# Patient Record
Sex: Female | Born: 1995 | Race: White | Hispanic: No | Marital: Single | Smoking: Current every day smoker
Health system: Southern US, Community
[De-identification: ages and names within clinical notes are randomized; demographics above are authoritative.]

## PROBLEM LIST (undated history)

## (undated) DIAGNOSIS — I10 Essential (primary) hypertension: Secondary | ICD-10-CM

## (undated) DIAGNOSIS — F319 Bipolar disorder, unspecified: Secondary | ICD-10-CM

## (undated) HISTORY — PX: NO PAST SURGERIES: SHX2092

---
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
## 2017-08-15 ENCOUNTER — Emergency Department: Payer: Medicaid Other

## 2017-08-15 ENCOUNTER — Emergency Department
Admission: EM | Admit: 2017-08-15 | Discharge: 2017-08-15 | Disposition: A | Discharge status: Home / Self Care | Payer: Medicaid Other | Attending: Student in an Organized Health Care Education/Training Program | Admitting: Student in an Organized Health Care Education/Training Program

## 2017-08-15 ENCOUNTER — Other Ambulatory Visit: Payer: Self-pay

## 2017-08-15 DIAGNOSIS — R1031 Right lower quadrant pain: Secondary | ICD-10-CM | POA: Insufficient documentation

## 2017-08-15 DIAGNOSIS — R102 Pelvic and perineal pain: Secondary | ICD-10-CM | POA: Insufficient documentation

## 2017-08-15 DIAGNOSIS — N739 Female pelvic inflammatory disease, unspecified: Secondary | ICD-10-CM | POA: Diagnosis not present

## 2017-08-15 DIAGNOSIS — F17210 Nicotine dependence, cigarettes, uncomplicated: Secondary | ICD-10-CM | POA: Insufficient documentation

## 2017-08-15 DIAGNOSIS — N898 Other specified noninflammatory disorders of vagina: Secondary | ICD-10-CM | POA: Insufficient documentation

## 2017-08-15 DIAGNOSIS — R1084 Generalized abdominal pain: Secondary | ICD-10-CM | POA: Diagnosis present

## 2017-08-15 DIAGNOSIS — N730 Acute parametritis and pelvic cellulitis: Secondary | ICD-10-CM

## 2017-08-15 LAB — POCT PREGNANCY, URINE: Preg Test, Ur: NEGATIVE

## 2017-08-15 LAB — WET PREP, GENITAL
Sperm: NONE SEEN
Trich, Wet Prep: NONE SEEN
Yeast Wet Prep HPF POC: NONE SEEN

## 2017-08-15 LAB — URINALYSIS, COMPLETE (UACMP) WITH MICROSCOPIC
Bacteria, UA: NONE SEEN
Glucose, UA: NEGATIVE mg/dL
Hgb urine dipstick: NEGATIVE
Ketones, ur: 5 mg/dL — AB
Nitrite: NEGATIVE
Protein, ur: 100 mg/dL — AB
RBC / HPF: >50 RBC/hpf — ABNORMAL HIGH (ref 0–5)
Specific Gravity, Urine: 1.033 — ABNORMAL HIGH (ref 1.005–1.030)
WBC, UA: >50 WBC/hpf — ABNORMAL HIGH (ref 0–5)
pH: 6 (ref 5.0–8.0)

## 2017-08-15 LAB — CBC WITH DIFFERENTIAL/PLATELET
Basophils Absolute: 0 K/uL (ref 0–0.1)
Basophils Relative: 0 %
Eosinophils Absolute: 0.1 K/uL (ref 0–0.7)
Eosinophils Relative: 1 %
HCT: 37.6 % (ref 35.0–47.0)
Hemoglobin: 12.6 g/dL (ref 12.0–16.0)
Lymphocytes Relative: 19 %
Lymphs Abs: 1.9 K/uL (ref 1.0–3.6)
MCH: 29.6 pg (ref 26.0–34.0)
MCHC: 33.6 g/dL (ref 32.0–36.0)
MCV: 88.1 fL (ref 80.0–100.0)
Monocytes Absolute: 1.2 K/uL — ABNORMAL HIGH (ref 0.2–0.9)
Monocytes Relative: 11 %
Neutro Abs: 7.1 K/uL — ABNORMAL HIGH (ref 1.4–6.5)
Neutrophils Relative %: 69 %
Platelets: 235 K/uL (ref 150–440)
RBC: 4.27 MIL/uL (ref 3.80–5.20)
RDW: 13.6 % (ref 11.5–14.5)
WBC: 10.3 K/uL (ref 3.6–11.0)

## 2017-08-15 LAB — CHLAMYDIA/NGC RT PCR (ARMC ONLY)
Chlamydia Tr: DETECTED — AB
N gonorrhoeae: DETECTED — AB

## 2017-08-15 LAB — COMPREHENSIVE METABOLIC PANEL
ALBUMIN: 3.8 g/dL (ref 3.5–5.0)
ALT: 9 U/L (ref 0–44)
ANION GAP: 5 (ref 5–15)
AST: 16 U/L (ref 15–41)
Alkaline Phosphatase: 59 U/L (ref 38–126)
BILIRUBIN TOTAL: 0.6 mg/dL (ref 0.3–1.2)
BUN: 8 mg/dL (ref 6–20)
CO2: 27 mmol/L (ref 22–32)
Calcium: 8.7 mg/dL — ABNORMAL LOW (ref 8.9–10.3)
Chloride: 105 mmol/L (ref 98–111)
Creatinine, Ser: 0.86 mg/dL (ref 0.44–1.00)
GFR calc Af Amer: >60 mL/min (ref >60)
GLUCOSE: 99 mg/dL (ref 70–99)
POTASSIUM: 3.7 mmol/L (ref 3.5–5.1)
Sodium: 137 mmol/L (ref 135–145)
TOTAL PROTEIN: 7.1 g/dL (ref 6.5–8.1)

## 2017-08-15 MED ORDER — PROMETHAZINE HCL 25 MG/ML IJ SOLN
12.5000 mg | Freq: Four times a day (QID) | INTRAMUSCULAR | Status: DC | PRN
Start: 2017-08-15 — End: 2017-08-15
  Administered 2017-08-15: 12.5 mg via INTRAVENOUS
  Filled 2017-08-15: qty 1

## 2017-08-15 MED ORDER — AZITHROMYCIN 500 MG PO TABS
1000.0000 mg | ORAL_TABLET | Freq: Once | ORAL | Status: AC
  Administered 2017-08-15: 1000 mg via ORAL
  Filled 2017-08-15: qty 2

## 2017-08-15 MED ORDER — METRONIDAZOLE 500 MG PO TABS: 500.0000 mg | ORAL_TABLET | Freq: Two times a day (BID) | ORAL | 0 refills | Status: AC

## 2017-08-15 MED ORDER — PROMETHAZINE HCL 12.5 MG PO TABS: 12.5000 mg | ORAL_TABLET | Freq: Four times a day (QID) | ORAL | 0 refills | Status: DC | PRN

## 2017-08-15 MED ORDER — DOXYCYCLINE HYCLATE 100 MG PO TABS: 100.0000 mg | ORAL_TABLET | Freq: Two times a day (BID) | ORAL | 0 refills | Status: AC

## 2017-08-15 MED ORDER — ONDANSETRON HCL 4 MG/2ML IJ SOLN
4.0000 mg | Freq: Once | INTRAMUSCULAR | Status: AC
  Administered 2017-08-15: 4 mg via INTRAVENOUS
  Filled 2017-08-15: qty 2

## 2017-08-15 MED ORDER — SODIUM CHLORIDE 0.9 % IV BOLUS
1000.0000 mL | Freq: Once | INTRAVENOUS | Status: AC
  Administered 2017-08-15: 1000 mL via INTRAVENOUS

## 2017-08-15 MED ORDER — DOXYCYCLINE HYCLATE 100 MG PO TABS
100.0000 mg | ORAL_TABLET | Freq: Once | ORAL | Status: AC
  Administered 2017-08-15: 100 mg via ORAL
  Filled 2017-08-15: qty 1

## 2017-08-15 MED ORDER — KETOROLAC TROMETHAMINE 30 MG/ML IJ SOLN
15.0000 mg | Freq: Once | INTRAMUSCULAR | Status: AC
  Administered 2017-08-15: 15 mg via INTRAVENOUS
  Filled 2017-08-15: qty 1

## 2017-08-15 MED ORDER — MORPHINE SULFATE (PF) 4 MG/ML IV SOLN
4.0000 mg | INTRAVENOUS | Status: DC | PRN
  Administered 2017-08-15 (×2): 4 mg via INTRAVENOUS
  Filled 2017-08-15 (×2): qty 1

## 2017-08-15 MED ORDER — HYDROCODONE-ACETAMINOPHEN 5-325 MG PO TABS: 1.0000 | ORAL_TABLET | ORAL | 0 refills | Status: DC | PRN

## 2017-08-15 MED ORDER — IOPAMIDOL (ISOVUE-370) INJECTION 76%
75.0000 mL | Freq: Once | INTRAVENOUS | Status: AC | PRN
  Administered 2017-08-15: 75 mL via INTRAVENOUS

## 2017-08-15 MED ORDER — CEFTRIAXONE SODIUM 250 MG IJ SOLR
250.0000 mg | Freq: Once | INTRAMUSCULAR | Status: AC
Start: 2017-08-15 — End: 2017-08-15
  Administered 2017-08-15: 250 mg via INTRAMUSCULAR
  Filled 2017-08-15: qty 250

## 2017-08-15 NOTE — ED Provider Notes (Signed)
[facility] Emergency Department Provider Note    First MD Initiated Contact with Patient 08/15/17 [phone]     (approximate)  I have reviewed the triage vital signs and the nursing notes.   HISTORY  Chief Complaint Abdominal Pain    HPI Tammy Owen is a 22 y.o. female G2, P2 presents the ER with chief complaint of 3 days of progressively worsening generalized abdominal pain.  States that initially started on the right lower quadrant and progressed.  Denies any fevers but has not been able to eat much.  Denies any vomiting.  No diarrhea.  States that 3 days ago she did have heavy menstrual cycle consistent with very heavy periods and was having crampy abdominal pain then.  Since then she has noted some vaginal discharge.  She is still sexually active.  Unsure if she is pregnant today.  Came to the ER because the pain is been unrelenting.    History reviewed. No pertinent past medical history. No family history on file. History reviewed. No pertinent surgical history. There are no active problems to display for this patient.     Prior to Admission medications   Medication Sig Start Date End Date Taking? Authorizing Provider  doxycycline (VIBRA-TABS) 100 MG tablet Take 1 tablet (100 mg total) by mouth 2 (two) times daily for 14 days. 08/15/17 08/29/17  Willy Eddy, MD  HYDROcodone-acetaminophen (NORCO) 5-325 MG tablet Take 1 tablet by mouth every 4 (four) hours as needed for moderate pain. 08/15/17   Willy Eddy, MD  metroNIDAZOLE (FLAGYL) 500 MG tablet Take 1 tablet (500 mg total) by mouth 2 (two) times daily for 14 days. 08/15/17 08/29/17  Willy Eddy, MD  promethazine (PHENERGAN) 12.5 MG tablet Take 1 tablet (12.5 mg total) by mouth every 6 (six) hours as needed for nausea or vomiting. 08/15/17   Willy Eddy, MD    Allergies Robitussin (alcohol free) [guaifenesin]    Social History Social History   Tobacco Use  . Smoking  status: Current Every Day Smoker    Types: Cigarettes  . Smokeless tobacco: Never Used  Substance Use Topics  . Alcohol use: Yes    Comment: socially  . Drug use: No    Review of Systems Patient denies headaches, rhinorrhea, blurry vision, numbness, shortness of breath, chest pain, edema, cough, abdominal pain, nausea, vomiting, diarrhea, dysuria, fevers, rashes or hallucinations unless otherwise stated above in HPI. ____________________________________________   PHYSICAL EXAM:  VITAL SIGNS: Vitals:   08/15/17 1144 08/15/17 1300  BP: 115/73 104/70  Pulse: 78 83  Resp: 16   Temp:    SpO2: 100% 99%    Constitutional: Alert and oriented.  Eyes: Conjunctivae are normal.  Head: Atraumatic. Nose: No congestion/rhinnorhea. Mouth/Throat: Mucous membranes are moist.   Neck: No stridor. Painless ROM.  Cardiovascular: Normal rate, regular rhythm. Grossly normal heart sounds.  Good peripheral circulation. Respiratory: Normal respiratory effort.  No retractions. Lungs CTAB. Gastrointestinal: Soft with ttp most noted in RLQ. No distention. No abdominal bruits. No CVA tenderness. Genitourinary: Normal-appearing genitalia no erythema of the cervix but there is foul-smelling greenish discharge coming from the cervical loss.  Does have some pain but not exquisite cervical motion tenderness. Musculoskeletal: No lower extremity tenderness nor edema.  No joint effusions. Neurologic:  Normal speech and language. No gross focal neurologic deficits are appreciated. No facial droop Skin:  Skin is warm, dry and intact. No rash noted. Psychiatric: Mood and affect are normal. Speech and behavior are normal.  ____________________________________________   LABS (all labs ordered are listed, but only abnormal results are displayed)  Results for orders placed or performed during the hospital encounter of 08/15/17 (from the past 24 hour(s))  CBC with Differential/Platelet     Status: Abnormal    Collection Time: 08/15/17  7:50 AM  Result Value Ref Range   WBC 10.3 3.6 - 11.0 K/uL   RBC 4.27 3.80 - 5.20 MIL/uL   Hemoglobin 12.6 12.0 - 16.0 g/dL   HCT 16.1 09.6 - 04.5 %   MCV 88.1 80.0 - 100.0 fL   MCH 29.6 26.0 - 34.0 pg   MCHC 33.6 32.0 - 36.0 g/dL   RDW 40.9 81.1 - 91.4 %   Platelets 235 150 - 440 K/uL   Neutrophils Relative % 69 %   Neutro Abs 7.1 (H) 1.4 - 6.5 K/uL   Lymphocytes Relative 19 %   Lymphs Abs 1.9 1.0 - 3.6 K/uL   Monocytes Relative 11 %   Monocytes Absolute 1.2 (H) 0.2 - 0.9 K/uL   Eosinophils Relative 1 %   Eosinophils Absolute 0.1 0 - 0.7 K/uL   Basophils Relative 0 %   Basophils Absolute 0.0 0 - 0.1 K/uL  Comprehensive metabolic panel     Status: Abnormal   Collection Time: 08/15/17  7:50 AM  Result Value Ref Range   Sodium 137 135 - 145 mmol/L   Potassium 3.7 3.5 - 5.1 mmol/L   Chloride 105 98 - 111 mmol/L   CO2 27 22 - 32 mmol/L   Glucose, Bld 99 70 - 99 mg/dL   BUN 8 6 - 20 mg/dL   Creatinine, Ser 7.82 0.44 - 1.00 mg/dL   Calcium 8.7 (L) 8.9 - 10.3 mg/dL   Total Protein 7.1 6.5 - 8.1 g/dL   Albumin 3.8 3.5 - 5.0 g/dL   AST 16 15 - 41 U/L   ALT 9 0 - 44 U/L   Alkaline Phosphatase 59 38 - 126 U/L   Total Bilirubin 0.6 0.3 - 1.2 mg/dL   GFR calc non Af Amer >60 >60 mL/min   GFR calc Af Amer >60 >60 mL/min   Anion gap 5 5 - 15  Urinalysis, Complete w Microscopic     Status: Abnormal   Collection Time: 08/15/17  7:50 AM  Result Value Ref Range   Color, Urine AMBER (A) YELLOW   APPearance CLOUDY (A) CLEAR   Specific Gravity, Urine 1.033 (H) 1.005 - 1.030   pH 6.0 5.0 - 8.0   Glucose, UA NEGATIVE NEGATIVE mg/dL   Hgb urine dipstick NEGATIVE NEGATIVE   Bilirubin Urine SMALL (A) NEGATIVE   Ketones, ur 5 (A) NEGATIVE mg/dL   Protein, ur 956 (A) NEGATIVE mg/dL   Nitrite NEGATIVE NEGATIVE   Leukocytes, UA MODERATE (A) NEGATIVE   RBC / HPF >50 (H) 0 - 5 RBC/hpf   WBC, UA >50 (H) 0 - 5 WBC/hpf   Bacteria, UA NONE SEEN NONE SEEN   Squamous  Epithelial / LPF 21-50 0 - 5   Mucus PRESENT   Pregnancy, urine POC     Status: None   Collection Time: 08/15/17  7:57 AM  Result Value Ref Range   Preg Test, Ur NEGATIVE NEGATIVE  Wet prep, genital     Status: Abnormal   Collection Time: 08/15/17  8:05 AM  Result Value Ref Range   Yeast Wet Prep HPF POC NONE SEEN NONE SEEN   Trich, Wet Prep NONE SEEN NONE SEEN   Clue Cells  Wet Prep HPF POC PRESENT (A) NONE SEEN   WBC, Wet Prep HPF POC MANY (A) NONE SEEN   Sperm NONE SEEN   Chlamydia/NGC rt PCR ([facility] only)     Status: Abnormal   Collection Time: 08/15/17  8:05 AM  Result Value Ref Range   Specimen source GC/Chlam ENDOCERVICAL    Chlamydia Tr DETECTED (A) NOT DETECTED   N gonorrhoeae DETECTED (A) NOT DETECTED   ____________________________________________ ____________________________________________  RADIOLOGY  I personally reviewed all radiographic images ordered to evaluate for the above acute complaints and reviewed radiology reports and findings.  These findings were personally discussed with the patient.  Please see medical record for radiology report.  ____________________________________________   PROCEDURES  Procedure(s) performed:  Procedures    Critical Care performed: no ____________________________________________   INITIAL IMPRESSION / ASSESSMENT AND PLAN / ED COURSE  Pertinent labs & imaging results that were available during my care of the patient were reviewed by me and considered in my medical decision making (see chart for details).   DDX: ectopic, torsion, appy, pid, toa, colitis, menses  Tammy Candice Larrick is a 22 y.o. who presents to the ED with right lower quadrant pain as described above.  Patient very uncomfortable appearing.  Physical exam as above.  Patient with some concerning abdominal findings suggestive of early peritonitis and given her presentation I am concerned for appendicitis.  Possible TOA.  CT imaging will be ordered to further  differentiate.  Will provide IV fluids as well as IV pain medication and IV antiemetics.  Clinical Course as of Aug 15 1441  Wed Aug 15, 2017  0951 CT scan does not show any evidence of acute appendicitis but there is right sided exophytic mass concerning for ovarian cyst.  Patient with worsening pain.  Will order ultrasound to evaluate for torsion.   [PR]  1106 Gonorrhea and Chlamydia are positive.  Patient will be treated for PID.  Ultrasound ordered due to concern for TOA.   [PR]  1325 Patient reassessed.  Pain improved.  Ultrasound shows evidence of probable dominant follicle and cyst.  No evidence of enhancement or hyperechoic material to suggest abscess.  Patient remains afebrile and no signs of sepsis.  At this point presentation most clinically consistent with PID and I do believe patient stable and appropriate for discharge home.   [PR]    Clinical Course User Index [PR] Willy Eddy, MD     As part of my medical decision making, I reviewed the following data within the electronic MEDICAL RECORD NUMBER Nursing notes reviewed and incorporated, Labs reviewed, notes from prior ED visits and [place] Controlled Substance Database   ____________________________________________   FINAL CLINICAL IMPRESSION(S) / ED DIAGNOSES  Final diagnoses:  Pelvic pain  Acute pelvic inflammatory disease (PID)      NEW MEDICATIONS STARTED DURING THIS VISIT:  Discharge Medication List as of 08/15/2017  1:33 PM    START taking these medications   Details  doxycycline (VIBRA-TABS) 100 MG tablet Take 1 tablet (100 mg total) by mouth 2 (two) times daily for 14 days., Starting Wed 08/15/2017, Until Wed 08/29/2017, Print    HYDROcodone-acetaminophen (NORCO) 5-325 MG tablet Take 1 tablet by mouth every 4 (four) hours as needed for moderate pain., Starting Wed 08/15/2017, Print    metroNIDAZOLE (FLAGYL) 500 MG tablet Take 1 tablet (500 mg total) by mouth 2 (two) times daily for 14 days., Starting Wed  08/15/2017, Until Wed 08/29/2017, Print    promethazine (PHENERGAN) 12.5 MG tablet Take  1 tablet (12.5 mg total) by mouth every 6 (six) hours as needed for nausea or vomiting., Starting Wed 08/15/2017, Print         Note:  This document was prepared using Dragon voice recognition software and may include unintentional dictation errors.    Willy Eddyobinson, Monesha Monreal, MD 08/15/17 [phone]

## 2017-08-15 NOTE — ED Notes (Signed)
Pt tolerating PO at this time.

## 2017-08-15 NOTE — ED Notes (Signed)
Patient transported to US 

## 2017-08-15 NOTE — ED Triage Notes (Addendum)
Pt c/o generalized abd pain for 3 days with N/V for the first 2 days. States she had an unusually heavy period last week.

## 2017-08-15 NOTE — ED Notes (Signed)
Patient transported to CT 

## 2017-08-15 NOTE — ED Notes (Signed)
EDP request PO challenge; pt provided graham crackers and peanut butter at this time.

## 2017-08-15 NOTE — ED Notes (Signed)
Pt stating that he pain began in her lower abdominal pain that spread all over. Pt stating a heavy periods since her baby 5 months ago with an abnormal bleeding a couple days ago. Pt stating some mild nausea. Pt stating blood in her urine. Pt stating yellow vaginal drainage.

## 2017-11-19 ENCOUNTER — Encounter: Payer: Self-pay | Admitting: Emergency Medicine

## 2017-11-19 ENCOUNTER — Ambulatory Visit
Admission: EM | Admit: 2017-11-19 | Discharge: 2017-11-19 | Disposition: A | Discharge status: Home / Self Care | Payer: Medicaid Other | Attending: Family Medicine | Admitting: Family Medicine

## 2017-11-19 ENCOUNTER — Other Ambulatory Visit: Payer: Self-pay

## 2017-11-19 DIAGNOSIS — N760 Acute vaginitis: Secondary | ICD-10-CM

## 2017-11-19 DIAGNOSIS — A5901 Trichomonal vulvovaginitis: Secondary | ICD-10-CM | POA: Insufficient documentation

## 2017-11-19 DIAGNOSIS — R109 Unspecified abdominal pain: Secondary | ICD-10-CM | POA: Diagnosis present

## 2017-11-19 DIAGNOSIS — Z79899 Other long term (current) drug therapy: Secondary | ICD-10-CM | POA: Diagnosis not present

## 2017-11-19 DIAGNOSIS — Z202 Contact with and (suspected) exposure to infections with a predominantly sexual mode of transmission: Secondary | ICD-10-CM | POA: Diagnosis present

## 2017-11-19 DIAGNOSIS — F17210 Nicotine dependence, cigarettes, uncomplicated: Secondary | ICD-10-CM | POA: Insufficient documentation

## 2017-11-19 DIAGNOSIS — B9689 Other specified bacterial agents as the cause of diseases classified elsewhere: Secondary | ICD-10-CM | POA: Diagnosis not present

## 2017-11-19 LAB — COMPREHENSIVE METABOLIC PANEL
ALT: 14 U/L (ref 0–44)
AST: 16 U/L (ref 15–41)
Albumin: 3.3 g/dL — ABNORMAL LOW (ref 3.5–5.0)
Alkaline Phosphatase: 54 U/L (ref 38–126)
Anion gap: 9 (ref 5–15)
BUN: 9 mg/dL (ref 6–20)
CHLORIDE: 99 mmol/L (ref 98–111)
CO2: 27 mmol/L (ref 22–32)
CREATININE: 0.59 mg/dL (ref 0.44–1.00)
Calcium: 9 mg/dL (ref 8.9–10.3)
GFR calc Af Amer: >60 mL/min (ref >60)
GLUCOSE: 101 mg/dL — AB (ref 70–99)
POTASSIUM: 4.3 mmol/L (ref 3.5–5.1)
SODIUM: 135 mmol/L (ref 135–145)
Total Bilirubin: 0.1 mg/dL — ABNORMAL LOW (ref 0.3–1.2)
Total Protein: 8.1 g/dL (ref 6.5–8.1)

## 2017-11-19 LAB — CBC WITH DIFFERENTIAL/PLATELET
ABS IMMATURE GRANULOCYTES: 0.03 10*3/uL (ref 0.00–0.07)
BASOS PCT: 1 %
Basophils Absolute: 0 10*3/uL (ref 0.0–0.1)
Eosinophils Absolute: 0.1 10*3/uL (ref 0.0–0.5)
Eosinophils Relative: 2 %
HCT: 37.4 % (ref 36.0–46.0)
Hemoglobin: 11.9 g/dL — ABNORMAL LOW (ref 12.0–15.0)
IMMATURE GRANULOCYTES: 0 %
LYMPHS ABS: 2.1 10*3/uL (ref 0.7–4.0)
Lymphocytes Relative: 24 %
MCH: 28.7 pg (ref 26.0–34.0)
MCHC: 31.8 g/dL (ref 30.0–36.0)
MCV: 90.3 fL (ref 80.0–100.0)
MONOS PCT: 7 %
Monocytes Absolute: 0.7 10*3/uL (ref 0.1–1.0)
NEUTROS ABS: 5.8 10*3/uL (ref 1.7–7.7)
NEUTROS PCT: 66 %
NRBC: 0 % (ref 0.0–0.2)
PLATELETS: 542 10*3/uL — AB (ref 150–400)
RBC: 4.14 MIL/uL (ref 3.87–5.11)
RDW: 13.4 % (ref 11.5–15.5)
WBC: 8.8 10*3/uL (ref 4.0–10.5)

## 2017-11-19 LAB — URINALYSIS, COMPLETE (UACMP) WITH MICROSCOPIC
BACTERIA UA: NONE SEEN
Bilirubin Urine: NEGATIVE
GLUCOSE, UA: NEGATIVE mg/dL
Ketones, ur: NEGATIVE mg/dL
Leukocytes, UA: NEGATIVE
Nitrite: NEGATIVE
PH: 6.5 (ref 5.0–8.0)
PROTEIN: NEGATIVE mg/dL
RBC / HPF: NONE SEEN RBC/hpf (ref 0–5)
Specific Gravity, Urine: 1.02 (ref 1.005–1.030)

## 2017-11-19 LAB — WET PREP, GENITAL
Sperm: NONE SEEN
Yeast Wet Prep HPF POC: NONE SEEN

## 2017-11-19 LAB — CHLAMYDIA/NGC RT PCR (ARMC ONLY)
CHLAMYDIA TR: DETECTED — AB
N GONORRHOEAE: NOT DETECTED

## 2017-11-19 LAB — LIPASE, BLOOD: Lipase: 24 U/L (ref 11–51)

## 2017-11-19 LAB — PREGNANCY, URINE: Preg Test, Ur: NEGATIVE

## 2017-11-19 MED ORDER — ONDANSETRON 8 MG PO TBDP
8.0000 mg | ORAL_TABLET | Freq: Once | ORAL | Status: AC
  Administered 2017-11-19: 8 mg via ORAL

## 2017-11-19 MED ORDER — METRONIDAZOLE 500 MG PO TABS: 500.0000 mg | ORAL_TABLET | Freq: Two times a day (BID) | ORAL | 0 refills | Status: DC

## 2017-11-19 MED ORDER — AZITHROMYCIN 500 MG PO TABS
1000.0000 mg | ORAL_TABLET | Freq: Once | ORAL | Status: AC
  Administered 2017-11-19: 1000 mg via ORAL

## 2017-11-19 NOTE — ED Triage Notes (Signed)
Patient c/o right side abdominal pain that started 2 weeks ago. Patient states she has had this pain before when she was diagnosed with chlamydia, gonorrhea and an ovarian cyst. She is concerned about STDs today. She is having yellow vaginal discharge.

## 2017-11-19 NOTE — Discharge Instructions (Addendum)
Other treatment recommendations after the other test results come back Over the counter analgesics (tylenol/advil) Go to Emergency Department if symptoms get worse

## 2017-11-19 NOTE — ED Provider Notes (Signed)
[facility]    CSN: [id number] Arrival date & time: 11/19/17  0907     History   Chief Complaint Chief Complaint  Patient presents with  . Exposure to STD  . Abdominal Pain    HPI Tammy Owen is a 22 y.o. female.   22 yo female with a c/o abdominal pains for 2 weeks. States symptoms feel similar to when she's had STD infections in the past. Patient was seen in the Emergency Department 10 days ago for this pain. Patient denies any fevers, vomiting, diarrhea, constipation, however does complain of nausea and yellow vaginal discharge.   The history is provided by the patient.    History reviewed. No pertinent past medical history.  There are no active problems to display for this patient.   History reviewed. No pertinent surgical history.  OB History   None      Home Medications    Prior to Admission medications   Medication Sig Start Date End Date Taking? Authorizing Provider  HYDROcodone-acetaminophen (NORCO) 5-325 MG tablet Take 1 tablet by mouth every 4 (four) hours as needed for moderate pain. 08/15/17   Willy Eddy, MD  metroNIDAZOLE (FLAGYL) 500 MG tablet Take 1 tablet (500 mg total) by mouth 2 (two) times daily. 11/19/17   Payton Mccallum, MD  promethazine (PHENERGAN) 12.5 MG tablet Take 1 tablet (12.5 mg total) by mouth every 6 (six) hours as needed for nausea or vomiting. 08/15/17   Willy Eddy, MD    Family History No family history on file.  Social History Social History   Tobacco Use  . Smoking status: Current Every Day Smoker    Types: Cigarettes  . Smokeless tobacco: Never Used  Substance Use Topics  . Alcohol use: Yes    Comment: socially  . Drug use: No     Allergies   Robitussin (alcohol free) [guaifenesin]   Review of Systems Review of Systems   Physical Exam Triage Vital Signs ED Triage Vitals  Enc Vitals Group     BP 11/19/17 0925 105/80     Pulse Rate 11/19/17 0925 85     Resp 11/19/17 0925  18     Temp 11/19/17 0925 98 F (36.7 C)     Temp Source 11/19/17 0925 Oral     SpO2 11/19/17 0925 100 %     Weight 11/19/17 0925 135 lb (61.2 kg)     Height 11/19/17 0925 5\' 2"  (1.575 m)     Head Circumference --      Peak Flow --      Pain Score 11/19/17 0924 10     Pain Loc --      Pain Edu? --      Excl. in GC? --    No data found.  Updated Vital Signs BP 100/78 (BP Location: Right Arm)   Pulse 80   Temp 98 F (36.7 C) (Oral)   Resp 18   Ht 5\' 2"  (1.575 m)   Wt 61.2 kg   LMP 11/19/2017   SpO2 100%   BMI 24.69 kg/m   Visual Acuity Right Eye Distance:   Left Eye Distance:   Bilateral Distance:    Right Eye Near:   Left Eye Near:    Bilateral Near:     Physical Exam  Constitutional: She appears well-developed and well-nourished. No distress.  Cardiovascular: Normal rate.  Pulmonary/Chest: Effort normal. No respiratory distress.  Abdominal: Soft. Bowel sounds are normal. She exhibits no distension and no mass.  There is tenderness (mild; diffuse; no rebound or guarding). There is no rebound and no guarding. No hernia.  Skin: She is not diaphoretic.  Nursing note and vitals reviewed.    UC Treatments / Results  Labs (all labs ordered are listed, but only abnormal results are displayed) Labs Reviewed  WET PREP, GENITAL - Abnormal; Notable for the following components:      Result Value   Trich, Wet Prep PRESENT (*)    Clue Cells Wet Prep HPF POC PRESENT (*)    WBC, Wet Prep HPF POC MODERATE (*)    All other components within normal limits  URINALYSIS, COMPLETE (UACMP) WITH MICROSCOPIC - Abnormal; Notable for the following components:   Hgb urine dipstick MODERATE (*)    All other components within normal limits  CBC WITH DIFFERENTIAL/PLATELET - Abnormal; Notable for the following components:   Hemoglobin 11.9 (*)    Platelets 542 (*)    All other components within normal limits  COMPREHENSIVE METABOLIC PANEL - Abnormal; Notable for the following  components:   Glucose, Bld 101 (*)    Albumin 3.3 (*)    Total Bilirubin 0.1 (*)    All other components within normal limits  CHLAMYDIA/NGC RT PCR ([facility] ONLY)  LIPASE, BLOOD  PREGNANCY, URINE    EKG None  Radiology No results found.  Procedures Procedures (including critical care time)  Medications Ordered in UC Medications  ondansetron (ZOFRAN-ODT) disintegrating tablet 8 mg (8 mg Oral Given 11/19/17 1058)  azithromycin (ZITHROMAX) tablet 1,000 mg (1,000 mg Oral Given 11/19/17 1057)    Initial Impression / Assessment and Plan / UC Course  I have reviewed the triage vital signs and the nursing notes.  Pertinent labs & imaging results that were available during my care of the patient were reviewed by me and considered in my medical decision making (see chart for details).      Final Clinical Impressions(s) / UC Diagnoses   Final diagnoses:  Trichomonas vaginitis  Bacterial vaginosis     Discharge Instructions     Other treatment recommendations after the other test results come back Over the counter analgesics (tylenol/advil) Go to Emergency Department if symptoms get worse    ED Prescriptions    Medication Sig Dispense Auth. Provider   metroNIDAZOLE (FLAGYL) 500 MG tablet Take 1 tablet (500 mg total) by mouth 2 (two) times daily. 14 tablet Payton Mccallum, MD     1.  Lab results and diagnoses reviewed with patient 2. Patient given zofran 8mg  odt and zithromax 1gm po x 1 (empiric) 3. rx as per orders above; reviewed possible side effects, interactions, risks and benefits  3. Recommend supportive treatment as above  4. Follow-up prn if symptoms worsen or don't improve   Controlled Substance Prescriptions [place] Controlled Substance Registry consulted? Not Applicable   Payton Mccallum, MD 11/19/17 1231

## 2017-11-20 ENCOUNTER — Telehealth (HOSPITAL_COMMUNITY): Payer: Self-pay

## 2017-11-20 NOTE — Telephone Encounter (Signed)
Chlamydia is positive.  This was treated at the urgent care visit with po zithromax 1g.  Pt needs education to please refrain from sexual intercourse for 7 days to give the medicine time to work.  Sexual partners need to be notified and tested/treated.  Condoms may reduce risk of reinfection.  Recheck or followup with PCP for further evaluation if symptoms are not improving.  [facility] notified.  Attempted to reach patient. No answer at this time. Voicemail left.    

## 2019-04-08 ENCOUNTER — Encounter: Payer: Self-pay | Admitting: Emergency Medicine

## 2019-04-08 ENCOUNTER — Emergency Department
Admission: EM | Admit: 2019-04-08 | Discharge: 2019-04-08 | Disposition: A | Discharge status: Home / Self Care | Payer: Self-pay | Attending: Emergency Medicine | Admitting: Emergency Medicine

## 2019-04-08 ENCOUNTER — Emergency Department: Payer: Self-pay

## 2019-04-08 ENCOUNTER — Other Ambulatory Visit: Payer: Self-pay

## 2019-04-08 DIAGNOSIS — O99891 Other specified diseases and conditions complicating pregnancy: Secondary | ICD-10-CM | POA: Insufficient documentation

## 2019-04-08 DIAGNOSIS — O99330 Smoking (tobacco) complicating pregnancy, unspecified trimester: Secondary | ICD-10-CM | POA: Insufficient documentation

## 2019-04-08 DIAGNOSIS — R1030 Lower abdominal pain, unspecified: Secondary | ICD-10-CM | POA: Insufficient documentation

## 2019-04-08 DIAGNOSIS — Z3491 Encounter for supervision of normal pregnancy, unspecified, first trimester: Secondary | ICD-10-CM

## 2019-04-08 DIAGNOSIS — Z3A09 9 weeks gestation of pregnancy: Secondary | ICD-10-CM | POA: Insufficient documentation

## 2019-04-08 DIAGNOSIS — F17210 Nicotine dependence, cigarettes, uncomplicated: Secondary | ICD-10-CM | POA: Insufficient documentation

## 2019-04-08 LAB — URINALYSIS, COMPLETE (UACMP) WITH MICROSCOPIC
Bacteria, UA: NONE SEEN
Bilirubin Urine: NEGATIVE
Glucose, UA: NEGATIVE mg/dL
Hgb urine dipstick: NEGATIVE
Ketones, ur: NEGATIVE mg/dL
Nitrite: NEGATIVE
Protein, ur: NEGATIVE mg/dL
Specific Gravity, Urine: 1.029 (ref 1.005–1.030)
pH: 5 (ref 5.0–8.0)

## 2019-04-08 LAB — CBC WITH DIFFERENTIAL/PLATELET
Abs Immature Granulocytes: 0.04 10*3/uL (ref 0.00–0.07)
Basophils Absolute: 0.1 10*3/uL (ref 0.0–0.1)
Basophils Relative: 1 %
Eosinophils Absolute: 0.1 10*3/uL (ref 0.0–0.5)
Eosinophils Relative: 1 %
HCT: 40.3 % (ref 36.0–46.0)
Hemoglobin: 13.4 g/dL (ref 12.0–15.0)
Immature Granulocytes: 1 %
Lymphocytes Relative: 26 %
Lymphs Abs: 2.1 10*3/uL (ref 0.7–4.0)
MCH: 31.1 pg (ref 26.0–34.0)
MCHC: 33.3 g/dL (ref 30.0–36.0)
MCV: 93.5 fL (ref 80.0–100.0)
Monocytes Absolute: 0.7 10*3/uL (ref 0.1–1.0)
Monocytes Relative: 9 %
Neutro Abs: 5.2 10*3/uL (ref 1.7–7.7)
Neutrophils Relative %: 62 %
Platelets: 363 10*3/uL (ref 150–400)
RBC: 4.31 MIL/uL (ref 3.87–5.11)
RDW: 11.9 % (ref 11.5–15.5)
WBC: 8.2 10*3/uL (ref 4.0–10.5)
nRBC: 0 % (ref 0.0–0.2)

## 2019-04-08 LAB — HCG, QUANTITATIVE, PREGNANCY: hCG, Beta Chain, Quant, S: 191133 m[IU]/mL — ABNORMAL HIGH (ref <5)

## 2019-04-08 LAB — COMPREHENSIVE METABOLIC PANEL
ALT: 11 U/L (ref 0–44)
AST: 16 U/L (ref 15–41)
Albumin: 4.1 g/dL (ref 3.5–5.0)
Alkaline Phosphatase: 38 U/L (ref 38–126)
Anion gap: 8 (ref 5–15)
BUN: 10 mg/dL (ref 6–20)
CO2: 26 mmol/L (ref 22–32)
Calcium: 9 mg/dL (ref 8.9–10.3)
Chloride: 101 mmol/L (ref 98–111)
Creatinine, Ser: 0.47 mg/dL (ref 0.44–1.00)
GFR calc Af Amer: >60 mL/min (ref >60)
GFR calc non Af Amer: >60 mL/min (ref >60)
Glucose, Bld: 82 mg/dL (ref 70–99)
Potassium: 3.8 mmol/L (ref 3.5–5.1)
Sodium: 135 mmol/L (ref 135–145)
Total Bilirubin: 0.6 mg/dL (ref 0.3–1.2)
Total Protein: 7.4 g/dL (ref 6.5–8.1)

## 2019-04-08 LAB — ABO/RH: ABO/RH(D): A POS

## 2019-04-08 LAB — POCT PREGNANCY, URINE: Preg Test, Ur: POSITIVE — AB

## 2019-04-08 NOTE — ED Provider Notes (Signed)
[facility] Emergency Department Provider Note  ____________________________________________   First MD Initiated Contact with Patient 04/08/19 [phone]     (approximate)  I have reviewed the triage vital signs and the nursing notes.   HISTORY  Chief Complaint Abdominal Pain   HPI Tammy Owen is a 24 y.o. female presents to the ED with complaint of lower abdominal pain.  Patient states that she is not had a period since January 20.  She states that she took a home pregnancy test which was positive.  She denies any urinary symptoms or vaginal bleeding.  She rates her pain as a 5 out of 10.       History reviewed. No pertinent past medical history.  There are no problems to display for this patient.   History reviewed. No pertinent surgical history.  Prior to Admission medications   Not on File    Allergies Robitussin (alcohol free) [guaifenesin]  No family history on file.  Social History Social History   Tobacco Use  . Smoking status: Current Every Day Smoker    Types: Cigarettes  . Smokeless tobacco: Never Used  Substance Use Topics  . Alcohol use: Yes    Comment: socially  . Drug use: No    Review of Systems Constitutional: No fever/chills Cardiovascular: Denies chest pain. Respiratory: Denies shortness of breath. Gastrointestinal: Mild lower abdominal pain.  No nausea, no vomiting.  No diarrhea.  No constipation. Genitourinary: Negative for dysuria.  Amenorrhea.  LMP 02/05/2019 Musculoskeletal: Negative for muscle aches. Skin: Negative for rash. Neurological: Negative for headaches, focal weakness or numbness. ___________________________________________   PHYSICAL EXAM:  VITAL SIGNS: ED Triage Vitals  Enc Vitals Group     BP 04/08/19 0937 138/74     Pulse Rate 04/08/19 0937 99     Resp 04/08/19 0937 18     Temp 04/08/19 0937 98.2 F (36.8 C)     Temp Source 04/08/19 0937 Oral     SpO2 04/08/19 0937 100 %   Weight 04/08/19 0932 130 lb (59 kg)     Height 04/08/19 0932 5' 2.5" (1.588 m)     Head Circumference --      Peak Flow --      Pain Score 04/08/19 0932 5     Pain Loc --      Pain Edu? --      Excl. in [place]? --    Constitutional: Alert and oriented. Well appearing and in no acute distress. Eyes: Conjunctivae are normal.  Head: Atraumatic. Neck: No stridor.   Cardiovascular: Normal rate, regular rhythm. Grossly normal heart sounds.  Good peripheral circulation. Respiratory: Normal respiratory effort.  No retractions. Lungs CTAB. Gastrointestinal: Soft with minimal suprapubic tenderness.  No distention. No CVA tenderness.  Bowel sounds are normoactive x4 quadrants. Musculoskeletal: Patient is able to move upper and lower extremities without any difficulty.  Normal gait was noted. Neurologic:  Normal speech and language. No gross focal neurologic deficits are appreciated. No gait instability. Skin:  Skin is warm, dry and intact. No rash noted. Psychiatric: Mood and affect are normal. Speech and behavior are normal.  ____________________________________________   LABS (all labs ordered are listed, but only abnormal results are displayed)  Labs Reviewed  URINALYSIS, COMPLETE (UACMP) WITH MICROSCOPIC - Abnormal; Notable for the following components:      Result Value   Color, Urine YELLOW (*)    APPearance CLOUDY (*)    Leukocytes,Ua SMALL (*)    All other components within  normal limits  HCG, QUANTITATIVE, PREGNANCY - Abnormal; Notable for the following components:   hCG, Beta Chain, Quant, S 191,133 (*)    All other components within normal limits  POCT PREGNANCY, URINE - Abnormal; Notable for the following components:   Preg Test, Ur POSITIVE (*)    All other components within normal limits  CBC WITH DIFFERENTIAL/PLATELET  COMPREHENSIVE METABOLIC PANEL  POC URINE PREG, ED  ABO/RH    RADIOLOGY   Official radiology report(s): US OB Comp Less 14 Wks  Result Date:  04/08/2019 CLINICAL DATA:  Abdominal pain for 2 days EXAM: OBSTETRIC <14 WK ULTRASOUND TECHNIQUE: Transabdominal ultrasound was performed for evaluation of the gestation as well as the maternal uterus and adnexal regions. COMPARISON:  None. FINDINGS: Intrauterine gestational sac: Single Yolk sac:  Visualized. Embryo:  Visualized. Cardiac Activity: Visualized. Heart Rate: 173 bpm CRL:   26.1 mm   9 w 3 d                  [place] EDC: 11/08/2019 Subchorionic hemorrhage: Small subchorionic hemorrhage measures 1.5 x 1.2 x 1.0 cm right superior aspect. Maternal uterus/adnexae: Right ovary measures 2.5 x 1.4 by 1.3 cm and the left ovary measures 3.0 x 1.4 x 1.9 cm. No free fluid. IMPRESSION: 1. Single live intrauterine pregnancy as above, estimated age 27 weeks and 3 days. 2. Small subchorionic hemorrhage. Electronically Signed   By: Sharlet Salina M.D.   On: 04/08/2019 11:38    ____________________________________________   PROCEDURES  Procedure(s) performed (including Critical Care):  Procedures   ____________________________________________   INITIAL IMPRESSION / ASSESSMENT AND PLAN / ED COURSE  As part of my medical decision making, I reviewed the following data within the electronic MEDICAL RECORD NUMBER Notes from prior ED visits and [place] Controlled Substance Database  24 year old female presents to the ED with complaint of some lower abdominal/pubic pain.  Patient denies any vaginal discharge or bleeding.  Patient states that she took a pregnancy test which was positive.  Her last LMP was 02/05/2019.  Beta hCG performed and labs were within normal limits and reassuring to the patient.  Ultrasound shows a single IUP and measuring approximately 9 weeks 3 days.  Patient is encouraged to see her OB/GYN in [place] and also instructed to discontinue smoking and NSAIDs.  Patient will return to the emergency department if any severe worsening of her symptoms or urgent  concerns.   ____________________________________________   FINAL CLINICAL IMPRESSION(S) / ED DIAGNOSES  Final diagnoses:  First trimester pregnancy     ED Discharge Orders    None       Note:  This document was prepared using Dragon voice recognition software and may include unintentional dictation errors.    Tommi Rumps, PA-C 04/08/19 1303    Jene Every, MD 04/08/19 1319

## 2019-04-08 NOTE — ED Notes (Signed)
Type and screen and red top tube sent to the lab.

## 2019-04-08 NOTE — ED Triage Notes (Signed)
Presents with some lower abd pain  States she recently had a positive preg test

## 2019-04-08 NOTE — Discharge Instructions (Addendum)
Follow-up with your OB/GYN for prenatal care.  You may take Tylenol as needed for discomfort however at this time you will need to discontinue any use of anti-inflammatories such as ibuprofen, Advil, Motrin, Aleve.  Continue to decrease smoking.  Return to the emergency department if any severe worsening of your symptoms.

## 2019-08-18 ENCOUNTER — Other Ambulatory Visit: Payer: Medicaid Other

## 2019-08-18 ENCOUNTER — Encounter: Payer: Medicaid Other | Admitting: Obstetrics and Gynecology

## 2019-10-10 ENCOUNTER — Ambulatory Visit
Admission: EM | Admit: 2019-10-10 | Discharge: 2019-10-10 | Disposition: A | Discharge status: Home / Self Care | Payer: Medicaid Other | Attending: Emergency Medicine | Admitting: Emergency Medicine

## 2019-10-10 DIAGNOSIS — B9689 Other specified bacterial agents as the cause of diseases classified elsewhere: Secondary | ICD-10-CM | POA: Diagnosis not present

## 2019-10-10 DIAGNOSIS — Z202 Contact with and (suspected) exposure to infections with a predominantly sexual mode of transmission: Secondary | ICD-10-CM

## 2019-10-10 DIAGNOSIS — Z3493 Encounter for supervision of normal pregnancy, unspecified, third trimester: Secondary | ICD-10-CM

## 2019-10-10 DIAGNOSIS — N760 Acute vaginitis: Secondary | ICD-10-CM | POA: Diagnosis not present

## 2019-10-10 LAB — CHLAMYDIA/NGC RT PCR (ARMC ONLY)
Chlamydia Tr: DETECTED — AB
N gonorrhoeae: DETECTED — AB

## 2019-10-10 LAB — WET PREP, GENITAL
Sperm: NONE SEEN
Trich, Wet Prep: NONE SEEN
Yeast Wet Prep HPF POC: NONE SEEN

## 2019-10-10 MED ORDER — METRONIDAZOLE 500 MG PO TABS: 500.0000 mg | ORAL_TABLET | Freq: Two times a day (BID) | ORAL | 0 refills | Status: AC

## 2019-10-10 MED ORDER — CEFTRIAXONE SODIUM 500 MG IJ SOLR
500.0000 mg | Freq: Once | INTRAMUSCULAR | Status: AC
  Administered 2019-10-10: 500 mg via INTRAMUSCULAR

## 2019-10-10 MED ORDER — AZITHROMYCIN 500 MG PO TABS
1000.0000 mg | ORAL_TABLET | Freq: Once | ORAL | Status: AC
  Administered 2019-10-10: 1000 mg via ORAL

## 2019-10-10 NOTE — ED Provider Notes (Signed)
[facility]    CSN: [id number] Arrival date & time: 10/10/19  1546      History   Chief Complaint Chief Complaint  Patient presents with  . Exposure to STD    Trich, gonarrhea, chlamydia    HPI Tammy Owen is a 24 y.o. female.   Patient presents for STI screening.  She says that she was exposed to gonorrhea, chlamydia and possible trichomonas through her boyfriend.  Patient is [redacted] weeks pregnant.  She denies any symptoms.  She denies fever, abdominal pain, back pain, dysuria, vaginal discharge, abnormal vaginal bleeding, genital lesions, vaginal itching.  Patient says her boyfriend was treated for STIs 2 weeks ago, but told her yesterday that he was positive.  She says he plans to go back to the health department today and get treated again.  Says she has smoked throughout her entire pregnancy and she spoke with her 2 children before.  She is fully aware she says of the risks of smoking while pregnant.  Patient has no other concerns today.  HPI  History reviewed. No pertinent past medical history.  There are no problems to display for this patient.   History reviewed. No pertinent surgical history.  OB History    Gravida  1   Para      Term      Preterm      AB      Living        SAB      TAB      Ectopic      Multiple      Live Births               Home Medications    Prior to Admission medications   Medication Sig Start Date End Date Taking? Authorizing Provider  metroNIDAZOLE (FLAGYL) 500 MG tablet Take 1 tablet (500 mg total) by mouth 2 (two) times daily for 7 days. 10/10/19 10/17/19  Shirlee Latch, PA-C  Prenatal Vit-Fe Fumarate-FA (PNV PRENATAL PLUS MULTIVITAMIN) 27-1 MG TABS Take by mouth.    [provider]    Family History History reviewed. No pertinent family history.  Social History Social History   Tobacco Use  . Smoking status: Current Every Day Smoker    Types: Cigarettes  . Smokeless tobacco:  Never Used  Vaping Use  . Vaping Use: Never used  Substance Use Topics  . Alcohol use: Yes    Comment: socially  . Drug use: No     Allergies   Robitussin (alcohol free) [guaifenesin]   Review of Systems Review of Systems  Constitutional: Negative for fever.  Gastrointestinal: Negative for abdominal pain, nausea and vomiting.  Genitourinary: Negative for dysuria, flank pain, frequency, hematuria, urgency, vaginal bleeding, vaginal discharge and vaginal pain.  Musculoskeletal: Negative for back pain.  Skin: Negative for rash.     Physical Exam Triage Vital Signs ED Triage Vitals  Enc Vitals Group     BP 10/10/19 1557 117/74     Pulse Rate 10/10/19 1557 (!) 105     Resp 10/10/19 1557 16     Temp 10/10/19 1557 98.1 F (36.7 C)     Temp Source 10/10/19 1557 Oral     SpO2 --      Weight 10/10/19 1600 145 lb (65.8 kg)     Height 10/10/19 1600 5' 2.5" (1.588 m)     Head Circumference --      Peak Flow --  Pain Score 10/10/19 1600 0     Pain Loc --      Pain Edu? --      Excl. in [place]? --    No data found.  Updated Vital Signs BP 117/74 (BP Location: Left Arm)   Pulse (!) 105   Temp 98.1 F (36.7 C) (Oral)   Resp 16   Ht 5' 2.5" (1.588 m)   Wt 145 lb (65.8 kg)   LMP 02/05/2019 (Approximate) Comment: [redacted] wks pregnant   BMI 26.10 kg/m     Physical Exam Vitals and nursing note reviewed.  Constitutional:      General: She is not in acute distress.    Appearance: Normal appearance. She is not ill-appearing or toxic-appearing.  HENT:     Head: Normocephalic and atraumatic.  Eyes:     General: No scleral icterus.       Right eye: No discharge.        Left eye: No discharge.     Conjunctiva/sclera: Conjunctivae normal.  Cardiovascular:     Rate and Rhythm: Normal rate and regular rhythm.     Heart sounds: Normal heart sounds.  Pulmonary:     Effort: Pulmonary effort is normal. No respiratory distress.     Breath sounds: Normal breath sounds.    Musculoskeletal:     Cervical back: Neck supple.  Skin:    General: Skin is dry.  Neurological:     General: No focal deficit present.     Mental Status: She is alert. Mental status is at baseline.     Motor: No weakness.     Gait: Gait normal.  Psychiatric:        Mood and Affect: Mood normal.        Behavior: Behavior normal.        Thought Content: Thought content normal.      UC Treatments / Results  Labs (all labs ordered are listed, but only abnormal results are displayed) Labs Reviewed  WET PREP, GENITAL - Abnormal; Notable for the following components:      Result Value   Clue Cells Wet Prep HPF POC PRESENT (*)    WBC, Wet Prep HPF POC FEW (*)    All other components within normal limits  CHLAMYDIA/NGC RT PCR [facility] ONLY)    EKG   Radiology No results found.  Procedures Procedures (including critical care time)  Medications Ordered in UC Medications  cefTRIAXone (ROCEPHIN) injection 500 mg (has no administration in time range)  azithromycin (ZITHROMAX) tablet 1,000 mg (has no administration in time range)    Initial Impression / Assessment and Plan / UC Course  I have reviewed the triage vital signs and the nursing notes.  Pertinent labs & imaging results that were available during my care of the patient were reviewed by me and considered in my medical decision making (see chart for details).   24 year old female who is [redacted] weeks pregnant presents for STI screening following exposure to gonorrhea, chlamydia and trichomonas.  Wet prep shows positive clue cells, treating for BV with metronidazole.  Patient treated in clinic with azithromycin and Rocephin for gonorrhea chlamydia.  Advised her to abstain from intercourse for a week.  Explained that her partner should be treated again and this should not have intercourse for a week.  Advised her to go to the emergency room for any pelvic pain, abdominal pain, vaginal bleeding, fevers, back pain etc. I did  speak with her some about smoking and some of  the negatives to smoking while pregnant including premature labor and low birthweight.  Patient says that she is aware.  Advised her to follow-up with [place] as needed otherwise proceed to the emergency room for any abdominal pain or if she goes into labor since she does not have an OB.   Final Clinical Impressions(s) / UC Diagnoses   Final diagnoses:  Exposure to STD  Third trimester pregnancy  Bacterial vaginosis     Discharge Instructions     A vaginal swab shows that you have bacterial vaginosis.  I have sent metronidazole to the pharmacy.  Make sure you start this.  BV infections that are untreated can lead to premature labor.  You have been treated for gonorrhea and chlamydia in the clinic with azithromycin and ceftriaxone.  If you have any fevers, abdominal pain, back pain, pelvic pain, vaginal bleeding, significant foul-smelling vaginal discharge, weakness, etc. go to emergency department.    ED Prescriptions    Medication Sig Dispense Auth. Provider   metroNIDAZOLE (FLAGYL) 500 MG tablet Take 1 tablet (500 mg total) by mouth 2 (two) times daily for 7 days. 14 tablet Gareth Morgan     PDMP not reviewed this encounter.   Shirlee Latch, PA-C 10/10/19 [phone]

## 2019-10-10 NOTE — Discharge Instructions (Addendum)
A vaginal swab shows that you have bacterial vaginosis.  I have sent metronidazole to the pharmacy.  Make sure you start this.  BV infections that are untreated can lead to premature labor.  You have been treated for gonorrhea and chlamydia in the clinic with azithromycin and ceftriaxone.  If you have any fevers, abdominal pain, back pain, pelvic pain, vaginal bleeding, significant foul-smelling vaginal discharge, weakness, etc. go to emergency department.

## 2019-10-10 NOTE — ED Triage Notes (Signed)
Patient in today for STD testing. Patient states her boyfriend tested positive for several STDs. Patient also states she is not having any sxs at this time. She is [redacted] weeks pregnant at time of this visit.

## 2019-11-06 DIAGNOSIS — F4310 Post-traumatic stress disorder, unspecified: Secondary | ICD-10-CM | POA: Diagnosis not present

## 2019-11-06 DIAGNOSIS — F17210 Nicotine dependence, cigarettes, uncomplicated: Secondary | ICD-10-CM | POA: Diagnosis not present

## 2019-11-06 DIAGNOSIS — O99344 Other mental disorders complicating childbirth: Secondary | ICD-10-CM | POA: Diagnosis not present

## 2019-11-06 DIAGNOSIS — F909 Attention-deficit hyperactivity disorder, unspecified type: Secondary | ICD-10-CM | POA: Diagnosis not present

## 2019-11-06 DIAGNOSIS — Z3A39 39 weeks gestation of pregnancy: Secondary | ICD-10-CM | POA: Diagnosis not present

## 2019-11-06 DIAGNOSIS — O99892 Other specified diseases and conditions complicating childbirth: Secondary | ICD-10-CM | POA: Diagnosis not present

## 2019-11-06 DIAGNOSIS — Z20822 Contact with and (suspected) exposure to covid-19: Secondary | ICD-10-CM | POA: Diagnosis not present

## 2019-11-06 DIAGNOSIS — O99334 Smoking (tobacco) complicating childbirth: Secondary | ICD-10-CM | POA: Diagnosis not present

## 2019-11-06 DIAGNOSIS — O99333 Smoking (tobacco) complicating pregnancy, third trimester: Secondary | ICD-10-CM | POA: Diagnosis not present

## 2019-11-06 DIAGNOSIS — O99324 Drug use complicating childbirth: Secondary | ICD-10-CM | POA: Diagnosis not present

## 2019-11-06 DIAGNOSIS — G4700 Insomnia, unspecified: Secondary | ICD-10-CM | POA: Diagnosis not present

## 2019-11-06 DIAGNOSIS — R030 Elevated blood-pressure reading, without diagnosis of hypertension: Secondary | ICD-10-CM | POA: Diagnosis not present

## 2019-11-06 DIAGNOSIS — F419 Anxiety disorder, unspecified: Secondary | ICD-10-CM | POA: Diagnosis not present

## 2020-01-16 IMAGING — US US PELV - US TRANSVAGINAL
1 series · 14 of 25 positions shown · non-contrast
Comparison: None

CLINICAL DATA: Pelvic pain



[Series 1: us pelv - us transvaginal · 0.22mm/px · 14 of 111 slices shown]
[im 1/111]
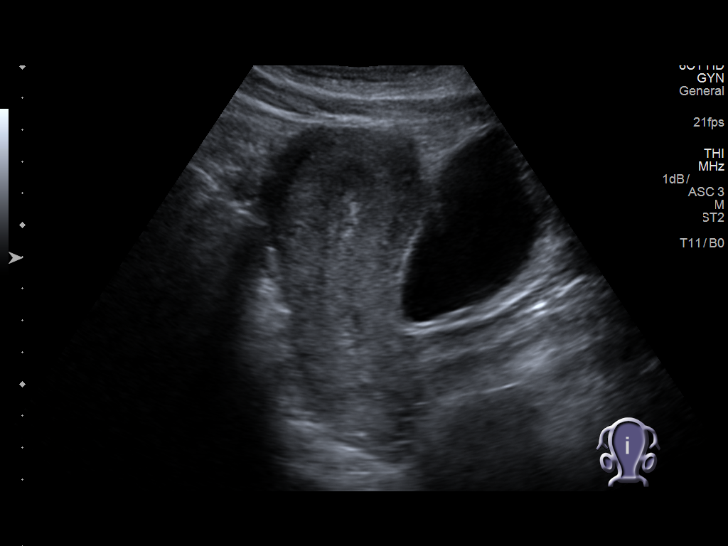
[im 10/111]
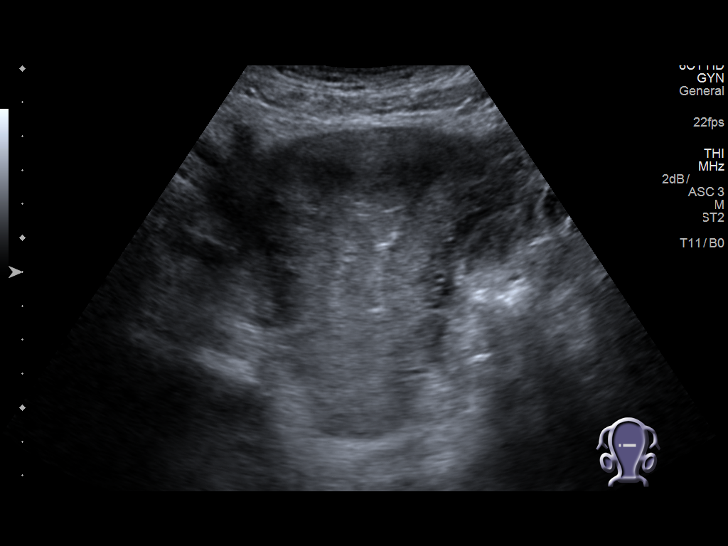
[im 19/111]
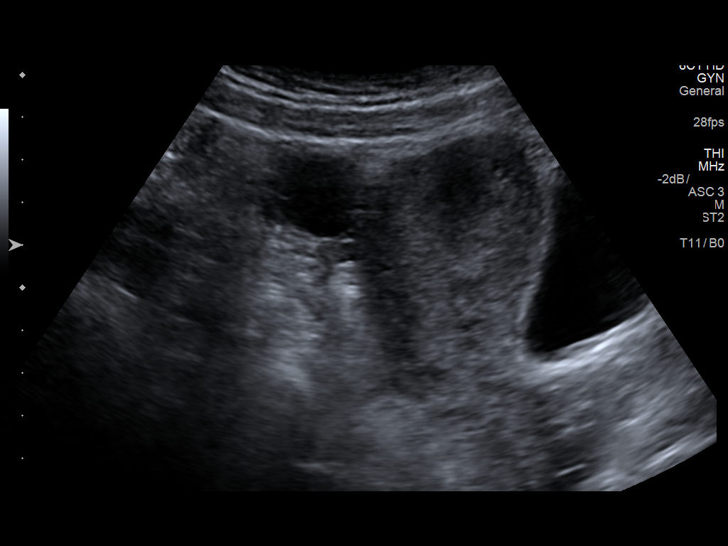
[im 28/111]
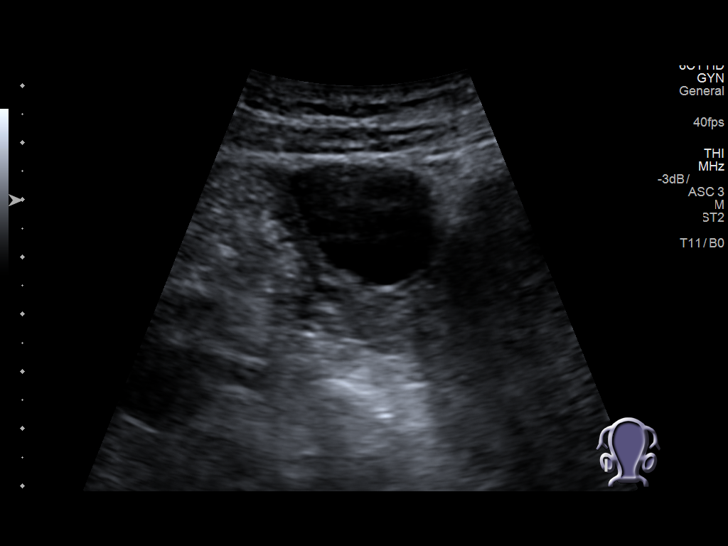
[im 37/111]
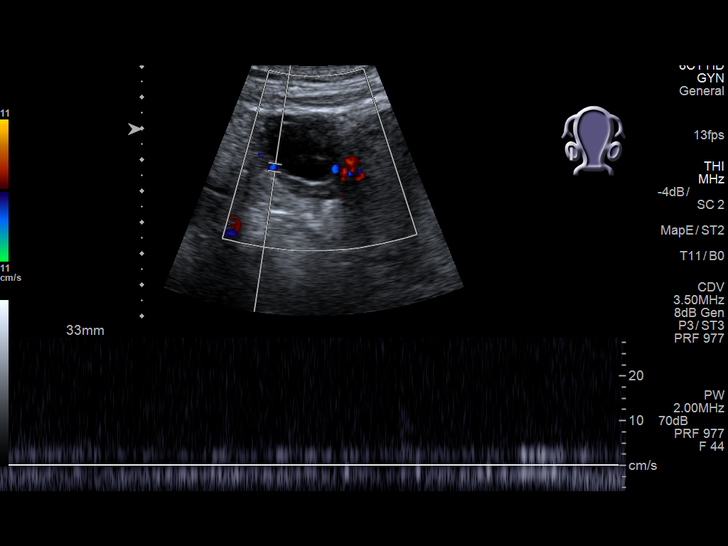
[im 42/111]
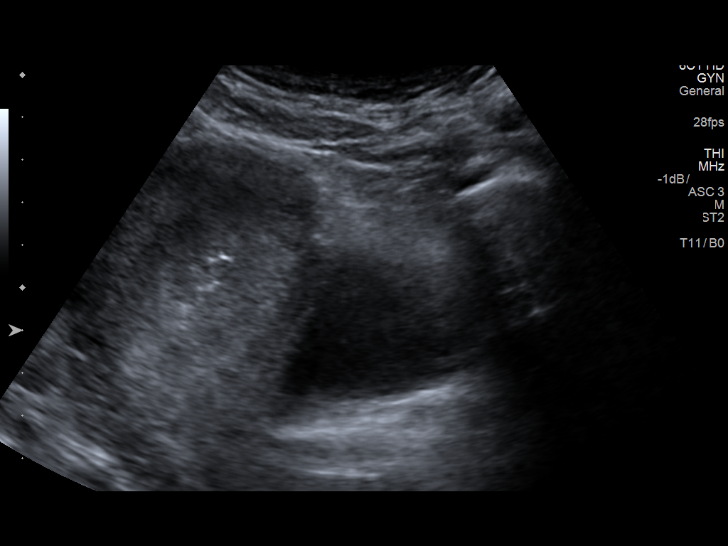
[im 51/111]
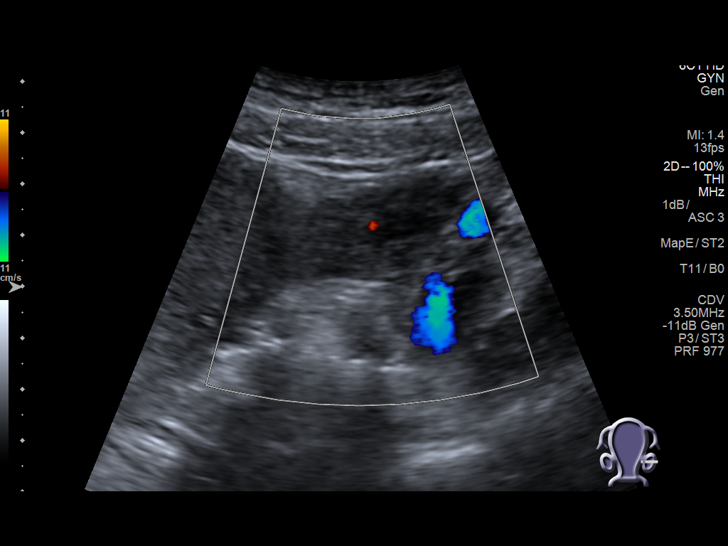
[im 60/111]
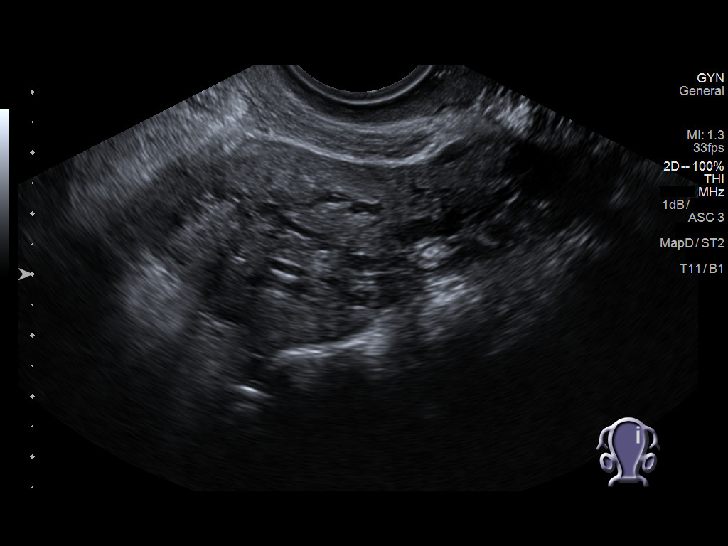
[im 69/111]
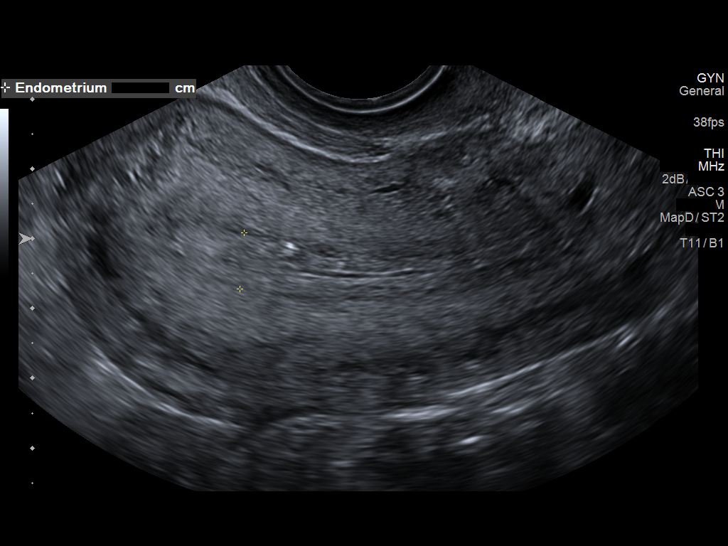
[im 74/111]
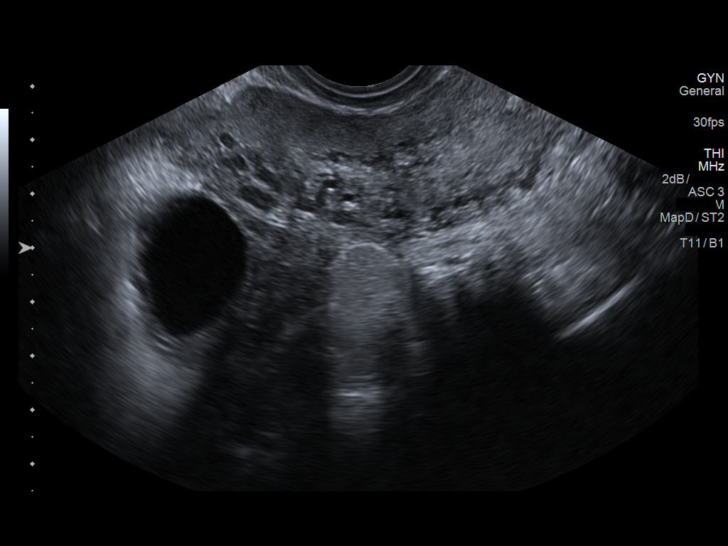
[im 83/111]
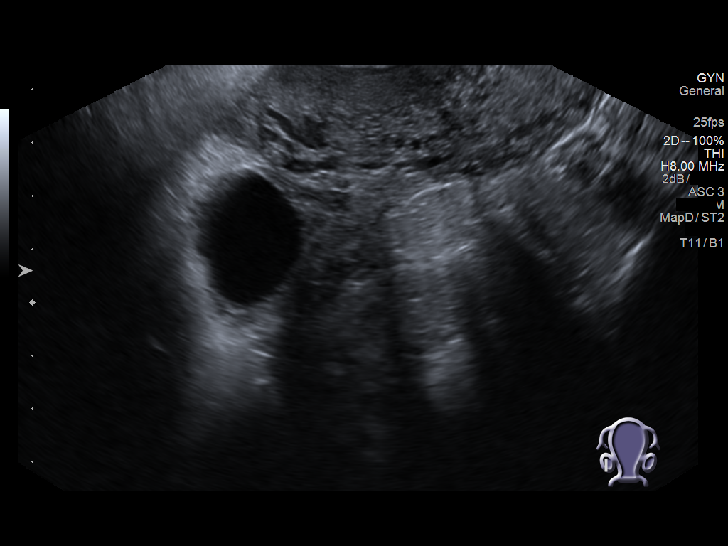
[im 92/111]
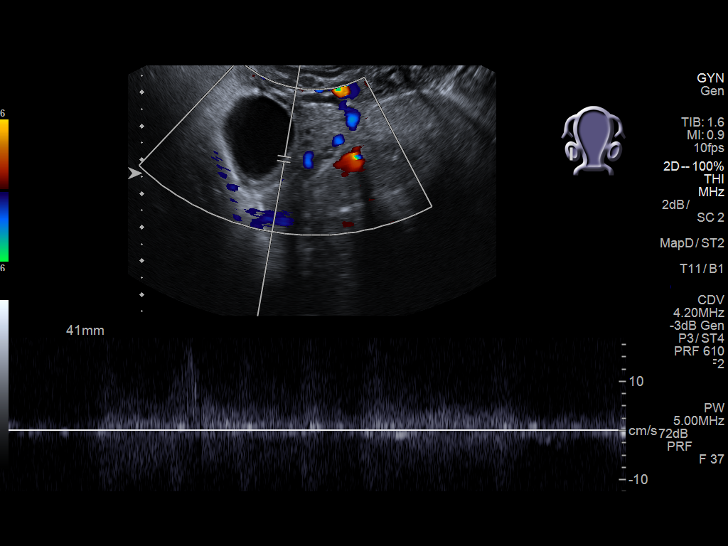
[im 101/111]
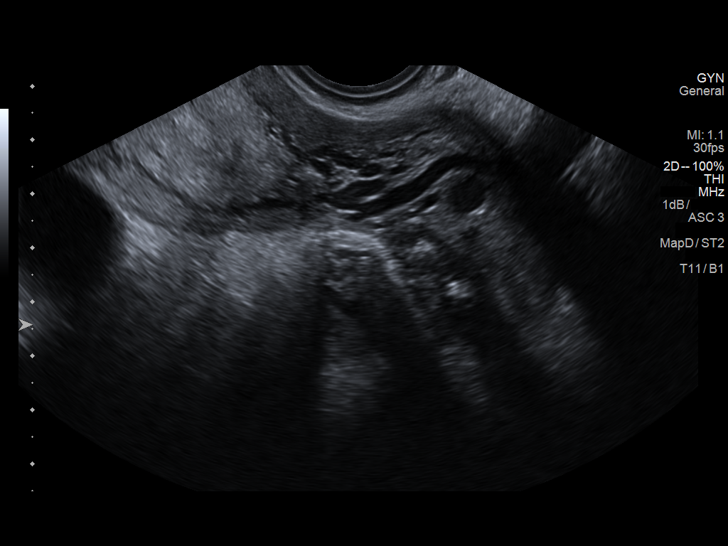
[im 111/111]
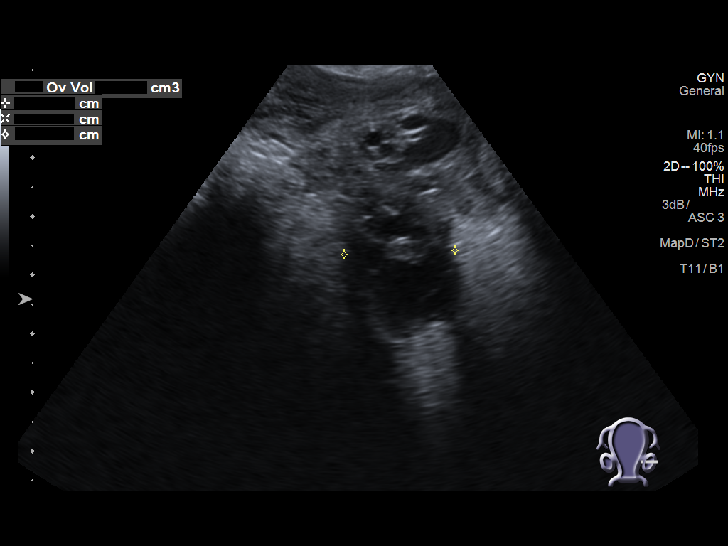

[14 of 25 positions shown; findings below may reference images not displayed]

FINDINGS: Uterus

Measurements: 11 x 5 x 7 cm.  No fibroids or other mass visualized.

Endometrium

Thickness: 8 mm. Few nonspecific echogenic foci along the ventral
endometrium without shadowing for gas or definite calcification.

Right ovary

Measurements: 41 x 31 x 29 mm.  Normal appearance/no adnexal mass.

Left ovary

Measurements: 33 x 18 x 19 mm.  Normal appearance/no adnexal mass.

Other findings

No abnormal free fluid.
IMPRESSION: No explanation for pelvic pain. The right pelvic cyst is attributed
to a simple dominant follicle.

## 2020-03-25 IMAGING — US US OB COMP LESS 14 WK
1 series · 14 of 28 positions shown · non-contrast
Comparison: None.

CLINICAL DATA: Abdominal pain for 2 days

EXAM:
OBSTETRIC <14 WK ULTRASOUND
TECHNIQUE: Transabdominal ultrasound was performed for evaluation of the
gestation as well as the maternal uterus and adnexal regions.

[Series 1: us ob less than 14 weeks with ob transvaginal · 14 of 51 slices shown]
[im 2/51]
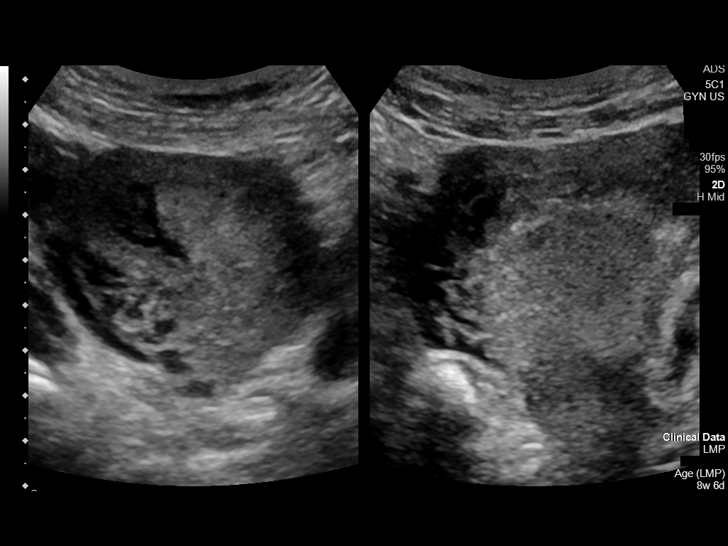
[im 6/51]
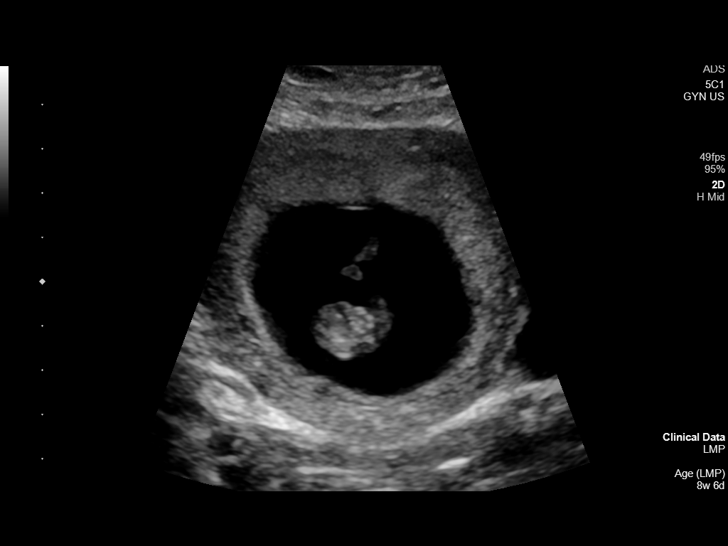
[im 10/51]
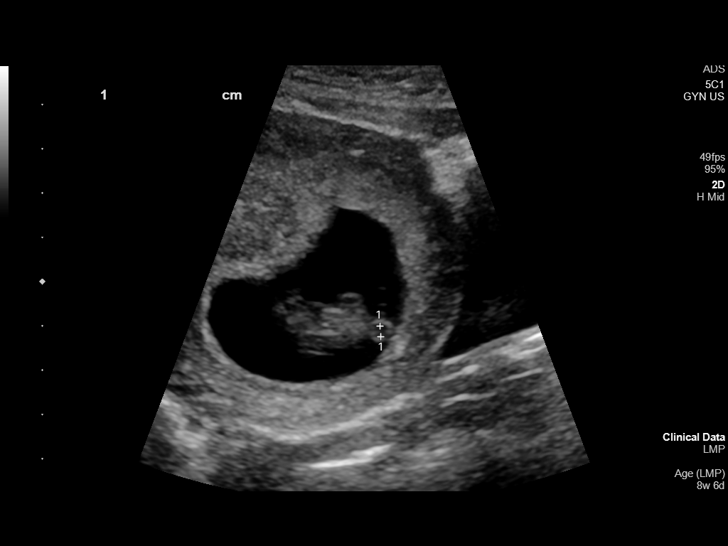
[im 13/51]
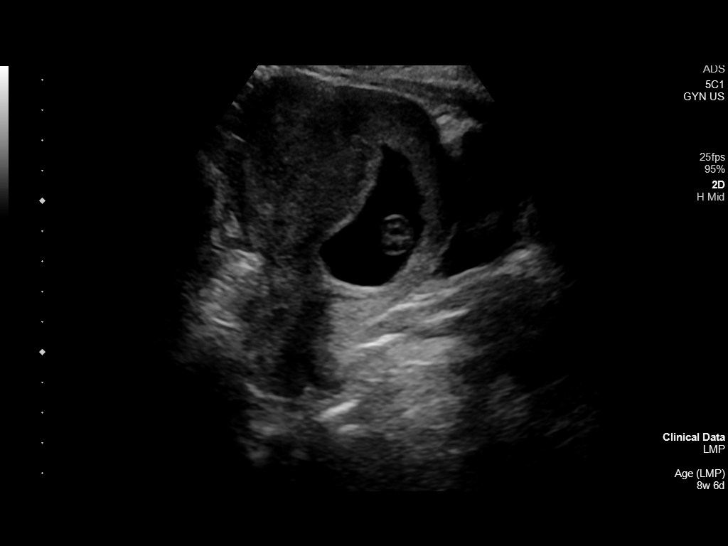
[im 17/51]
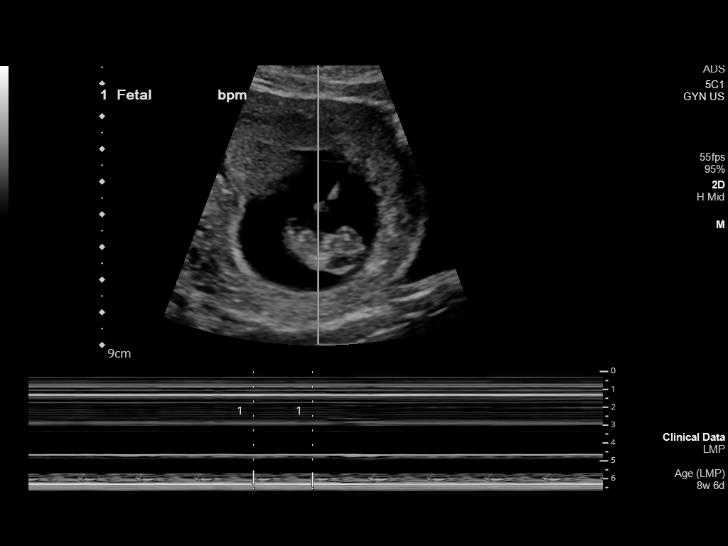
[im 21/51]
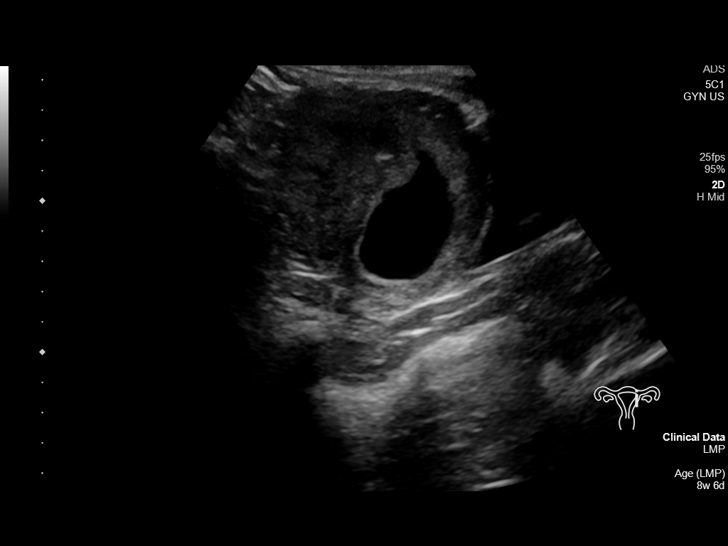
[im 25/51]
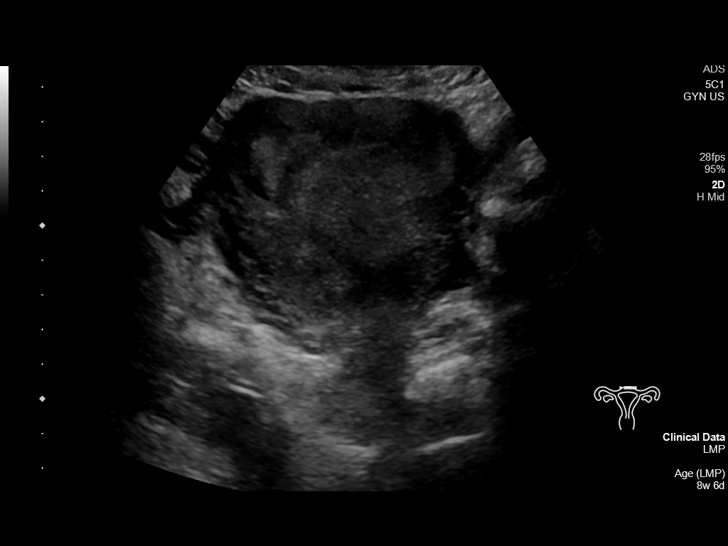
[im 28/51]
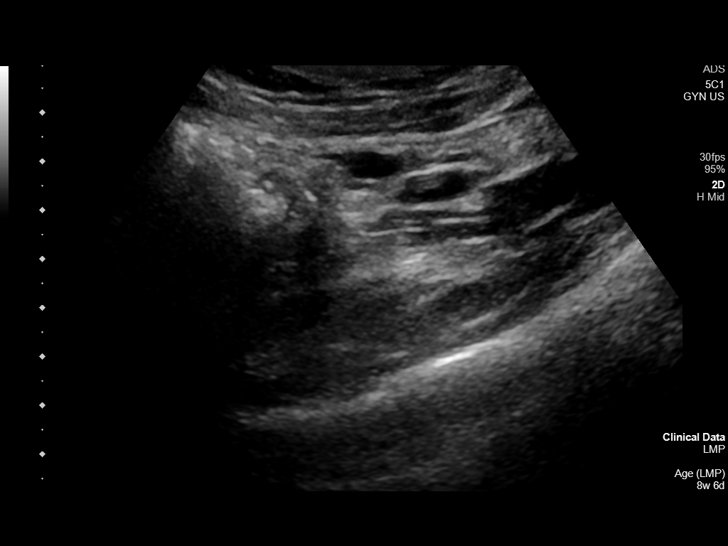
[im 32/51]
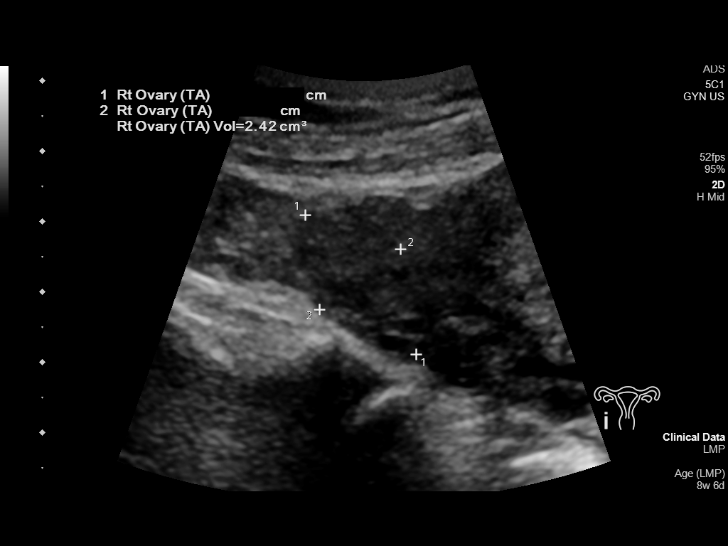
[im 36/51]
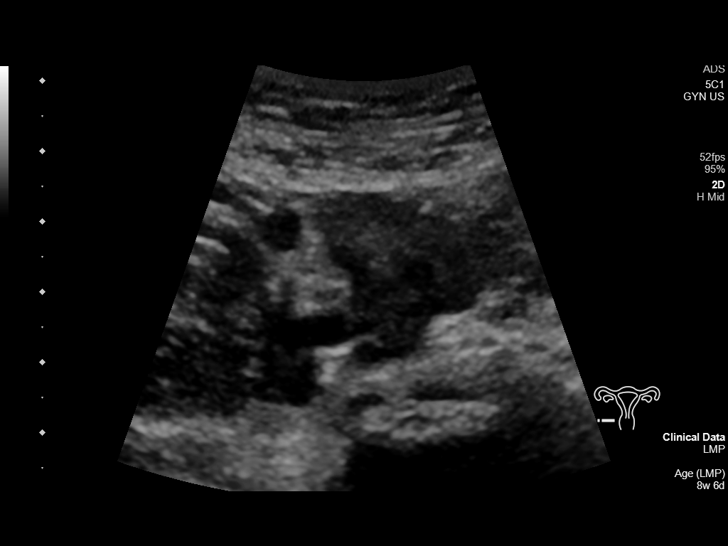
[im 39/51]
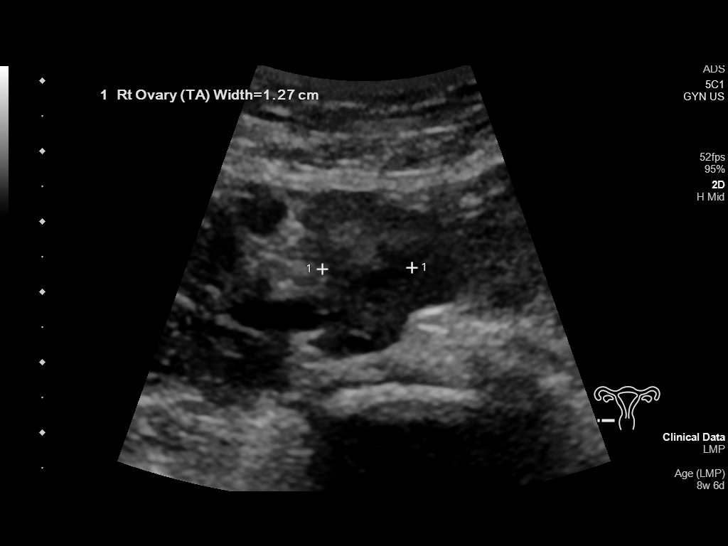
[im 43/51]
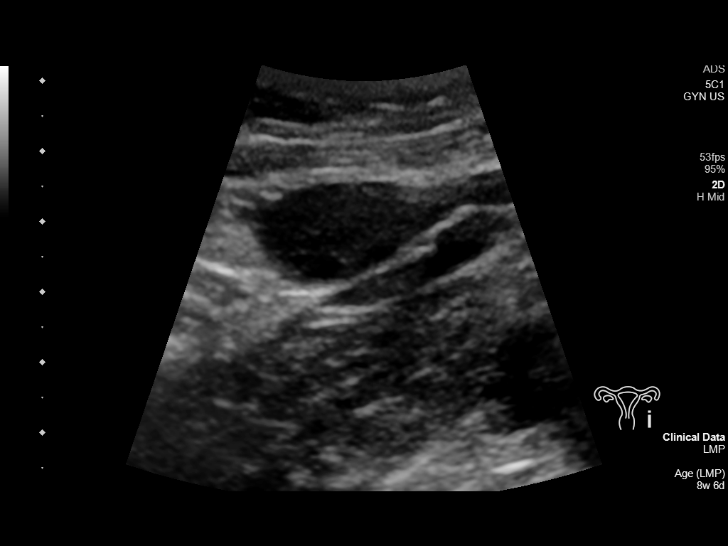
[im 47/51]
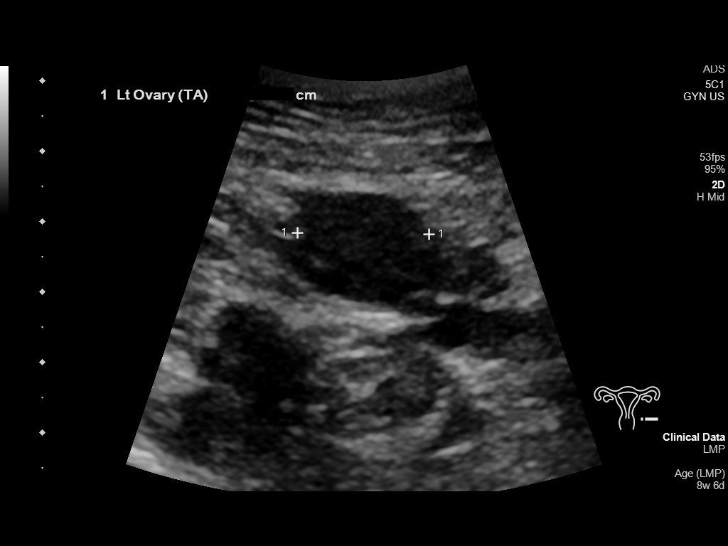
[im 51/51]
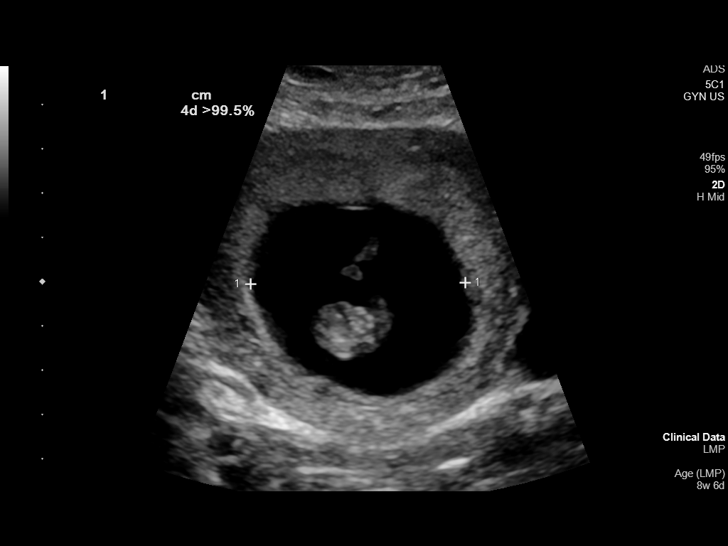

[14 of 28 positions shown; findings below may reference images not displayed]

FINDINGS: Intrauterine gestational sac: Single

Yolk sac:  Visualized.

Embryo:  Visualized.

Cardiac Activity: Visualized.

Heart Rate: 173 bpm

CRL:   26.1 mm   9 w 3 d                  US EDC: 11/08/2019

Subchorionic hemorrhage: Small subchorionic hemorrhage measures
x 1.2 x 1.0 cm right superior aspect.

Maternal uterus/adnexae: Right ovary measures 2.5 x 1.4 by 1.3 cm
and the left ovary measures 3.0 x 1.4 x 1.9 cm. No free fluid.
IMPRESSION: 1. Single live intrauterine pregnancy as above, estimated age 9
weeks and 3 days.
2. Small subchorionic hemorrhage.

## 2020-08-25 DIAGNOSIS — Z0389 Encounter for observation for other suspected diseases and conditions ruled out: Secondary | ICD-10-CM | POA: Diagnosis not present

## 2020-08-25 DIAGNOSIS — Z3009 Encounter for other general counseling and advice on contraception: Secondary | ICD-10-CM | POA: Diagnosis not present

## 2020-08-25 DIAGNOSIS — Z1388 Encounter for screening for disorder due to exposure to contaminants: Secondary | ICD-10-CM | POA: Diagnosis not present

## 2021-05-24 ENCOUNTER — Other Ambulatory Visit: Payer: Self-pay | Admitting: Student

## 2021-05-24 ENCOUNTER — Inpatient Hospital Stay (HOSPITAL_COMMUNITY)
Admission: AD | Admit: 2021-05-24 | Discharge: 2021-05-24 | Disposition: A | Discharge status: Home / Self Care | Payer: Medicaid Other | Attending: Obstetrics & Gynecology | Admitting: Obstetrics & Gynecology

## 2021-05-24 ENCOUNTER — Encounter (HOSPITAL_COMMUNITY): Payer: Self-pay | Admitting: Obstetrics & Gynecology

## 2021-05-24 DIAGNOSIS — O039 Complete or unspecified spontaneous abortion without complication: Secondary | ICD-10-CM | POA: Diagnosis not present

## 2021-05-24 DIAGNOSIS — Z8759 Personal history of other complications of pregnancy, childbirth and the puerperium: Secondary | ICD-10-CM

## 2021-05-24 DIAGNOSIS — I10 Essential (primary) hypertension: Secondary | ICD-10-CM | POA: Insufficient documentation

## 2021-05-24 DIAGNOSIS — Z743 Need for continuous supervision: Secondary | ICD-10-CM | POA: Diagnosis not present

## 2021-05-24 DIAGNOSIS — I959 Hypotension, unspecified: Secondary | ICD-10-CM | POA: Diagnosis not present

## 2021-05-24 HISTORY — DX: Essential (primary) hypertension: I10

## 2021-05-24 HISTORY — DX: Bipolar disorder, unspecified: F31.9

## 2021-05-24 LAB — CBC WITH DIFFERENTIAL/PLATELET
Abs Immature Granulocytes: 0.09 10*3/uL — ABNORMAL HIGH (ref 0.00–0.07)
Basophils Absolute: 0 10*3/uL (ref 0.0–0.1)
Basophils Relative: 0 %
Eosinophils Absolute: 0 10*3/uL (ref 0.0–0.5)
Eosinophils Relative: 0 %
HCT: 32.8 % — ABNORMAL LOW (ref 36.0–46.0)
Hemoglobin: 10.7 g/dL — ABNORMAL LOW (ref 12.0–15.0)
Immature Granulocytes: 1 %
Lymphocytes Relative: 10 %
Lymphs Abs: 1 10*3/uL (ref 0.7–4.0)
MCH: 29.4 pg (ref 26.0–34.0)
MCHC: 32.6 g/dL (ref 30.0–36.0)
MCV: 90.1 fL (ref 80.0–100.0)
Monocytes Absolute: 0.6 10*3/uL (ref 0.1–1.0)
Monocytes Relative: 6 %
Neutro Abs: 8.5 10*3/uL — ABNORMAL HIGH (ref 1.7–7.7)
Neutrophils Relative %: 83 %
Platelets: 252 10*3/uL (ref 150–400)
RBC: 3.64 MIL/uL — ABNORMAL LOW (ref 3.87–5.11)
RDW: 12.4 % (ref 11.5–15.5)
WBC: 10.2 10*3/uL (ref 4.0–10.5)
nRBC: 0 % (ref 0.0–0.2)

## 2021-05-24 LAB — HCG, QUANTITATIVE, PREGNANCY: hCG, Beta Chain, Quant, S: 39576 m[IU]/mL — ABNORMAL HIGH (ref <5)

## 2021-05-24 LAB — ABO/RH: ABO/RH(D): A POS

## 2021-05-24 LAB — RPR: RPR Ser Ql: NONREACTIVE

## 2021-05-24 MED ORDER — LACTATED RINGERS IV BOLUS
1000.0000 mL | Freq: Once | INTRAVENOUS | Status: AC
Start: 2021-05-24 — End: 2021-05-24
  Administered 2021-05-24: 1000 mL via INTRAVENOUS

## 2021-05-24 MED ORDER — KETOROLAC TROMETHAMINE 30 MG/ML IJ SOLN
30.0000 mg | Freq: Once | INTRAMUSCULAR | Status: AC
Start: 2021-05-24 — End: 2021-05-24
  Administered 2021-05-24: 30 mg via INTRAVENOUS
  Filled 2021-05-24: qty 1

## 2021-05-24 NOTE — MAU Note (Addendum)
.  Tammy Owen is a 26 y.o. at Unknown here in MAU via EMS reporting: spontaneous miscarriage at home after feeling pressure and getting up to go to the bathroom. Edd Arbour, CNM at bedside at time of patient arrival. Reports abdominal cramping all day. She recently found out she was pregnant in jail and was unable to get prenatal care. Patient has had a lot of stress and her brother passed away today due to suicide. Hx of HTN and amphetamine use. EMS arrived with the fetus. The patient and EMS state that they are unsure if the placenta was passed, but they believe it may have been passed into the toilet. Fetus wrapped in blanket and place in keepsake box per patient request. Patient ambulating to bathroom independently. RN notes small to moderate amount of bright red vaginal bleeding in the toilet.  ? ?Onset of complaint: Today ?Pain score: 5/10 ? ? ?

## 2021-05-24 NOTE — MAU Note (Signed)
Dr. Nelda Marseille signed Confirmation of Fetal Death for Cremation form.  ?

## 2021-05-24 NOTE — MAU Note (Addendum)
Offered patient for RN to take pictures of baby. Patient reports she will take pictures of the baby at home and reports the box she has for the baby with the blanket is all that she would like. ? ?Patient reports she will call [employer] for cremation.  ? ?Sheet with Funeral Home's given.  ?Confirmation of Fetal Death for Cremation copy given to patient. ?

## 2021-05-24 NOTE — Discharge Instructions (Signed)
-  referral made to behavioral health counseling ?-come to MAU if you start having foul-smelling discharge, fever, strong abdominal pain, heavy bleeding with clots.  ? ?
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
## 2021-05-24 NOTE — MAU Note (Signed)
Patient signed printed AVS instructions. Placed in chart. ?

## 2021-05-25 LAB — RUBELLA SCREEN: Rubella: <0.9 index — ABNORMAL LOW (ref >0.99)

## 2021-05-25 LAB — HSV 2 ANTIBODY, IGG: HSV 2 Glycoprotein G Ab, IgG: 18.2 index — ABNORMAL HIGH (ref 0.00–0.90)

## 2021-05-25 LAB — CMV ANTIBODY, IGG (EIA): CMV Ab - IgG: 1.3 U/mL — ABNORMAL HIGH (ref 0.00–0.59)

## 2021-05-25 LAB — HSV 1 ANTIBODY, IGG: HSV 1 Glycoprotein G Ab, IgG: <0.91 index (ref 0.00–0.90)

## 2021-05-25 LAB — TOXOPLASMA GONDII ANTIBODY, IGG: Toxoplasma IgG Ratio: <3 IU/mL (ref 0.0–7.1)

## 2021-05-25 NOTE — BH Specialist Note (Signed)
Pt did not arrive to video visit and did not answer the phone; Unable to leave voicemail as mailbox is full; unable to leave MyChart message as has not been set up.   Pt may reschedule by calling front office at [phone].

## 2021-06-07 ENCOUNTER — Ambulatory Visit: Payer: Self-pay | Admitting: Clinical

## 2021-06-07 DIAGNOSIS — Z91199 Patient's noncompliance with other medical treatment and regimen due to unspecified reason: Secondary | ICD-10-CM

## 2021-06-08 ENCOUNTER — Encounter: Payer: Medicaid Other | Admitting: Family Medicine

## 2021-06-16 DIAGNOSIS — Z419 Encounter for procedure for purposes other than remedying health state, unspecified: Secondary | ICD-10-CM | POA: Diagnosis not present

## 2021-07-16 DIAGNOSIS — Z419 Encounter for procedure for purposes other than remedying health state, unspecified: Secondary | ICD-10-CM | POA: Diagnosis not present

## 2021-08-16 DIAGNOSIS — Z419 Encounter for procedure for purposes other than remedying health state, unspecified: Secondary | ICD-10-CM | POA: Diagnosis not present

## 2021-09-16 DIAGNOSIS — Z419 Encounter for procedure for purposes other than remedying health state, unspecified: Secondary | ICD-10-CM | POA: Diagnosis not present

## 2021-10-16 DIAGNOSIS — Z419 Encounter for procedure for purposes other than remedying health state, unspecified: Secondary | ICD-10-CM | POA: Diagnosis not present

## 2021-11-16 DIAGNOSIS — Z419 Encounter for procedure for purposes other than remedying health state, unspecified: Secondary | ICD-10-CM | POA: Diagnosis not present

## 2021-12-16 DIAGNOSIS — Z419 Encounter for procedure for purposes other than remedying health state, unspecified: Secondary | ICD-10-CM | POA: Diagnosis not present

## 2022-01-16 DIAGNOSIS — Z419 Encounter for procedure for purposes other than remedying health state, unspecified: Secondary | ICD-10-CM | POA: Diagnosis not present

## 2022-02-16 DIAGNOSIS — Z419 Encounter for procedure for purposes other than remedying health state, unspecified: Secondary | ICD-10-CM | POA: Diagnosis not present

## 2022-03-17 DIAGNOSIS — Z419 Encounter for procedure for purposes other than remedying health state, unspecified: Secondary | ICD-10-CM | POA: Diagnosis not present

## 2022-04-17 DIAGNOSIS — Z419 Encounter for procedure for purposes other than remedying health state, unspecified: Secondary | ICD-10-CM | POA: Diagnosis not present

## 2022-05-17 DIAGNOSIS — Z419 Encounter for procedure for purposes other than remedying health state, unspecified: Secondary | ICD-10-CM | POA: Diagnosis not present

## 2022-06-17 DIAGNOSIS — Z419 Encounter for procedure for purposes other than remedying health state, unspecified: Secondary | ICD-10-CM | POA: Diagnosis not present

## 2022-07-17 DIAGNOSIS — Z419 Encounter for procedure for purposes other than remedying health state, unspecified: Secondary | ICD-10-CM | POA: Diagnosis not present

## 2022-08-01 DIAGNOSIS — Z6831 Body mass index (BMI) 31.0-31.9, adult: Secondary | ICD-10-CM | POA: Diagnosis not present

## 2022-08-01 DIAGNOSIS — R102 Pelvic and perineal pain: Secondary | ICD-10-CM | POA: Diagnosis not present

## 2022-08-01 DIAGNOSIS — N898 Other specified noninflammatory disorders of vagina: Secondary | ICD-10-CM | POA: Diagnosis not present

## 2022-08-01 DIAGNOSIS — F4310 Post-traumatic stress disorder, unspecified: Secondary | ICD-10-CM | POA: Diagnosis not present

## 2022-08-17 DIAGNOSIS — Z419 Encounter for procedure for purposes other than remedying health state, unspecified: Secondary | ICD-10-CM | POA: Diagnosis not present

## 2022-08-29 DIAGNOSIS — H5213 Myopia, bilateral: Secondary | ICD-10-CM | POA: Diagnosis not present

## 2022-08-30 DIAGNOSIS — H5203 Hypermetropia, bilateral: Secondary | ICD-10-CM | POA: Diagnosis not present

## 2022-09-17 DIAGNOSIS — Z419 Encounter for procedure for purposes other than remedying health state, unspecified: Secondary | ICD-10-CM | POA: Diagnosis not present

## 2022-10-17 DIAGNOSIS — Z419 Encounter for procedure for purposes other than remedying health state, unspecified: Secondary | ICD-10-CM | POA: Diagnosis not present

## 2022-11-17 DIAGNOSIS — Z419 Encounter for procedure for purposes other than remedying health state, unspecified: Secondary | ICD-10-CM | POA: Diagnosis not present

## 2022-11-27 DIAGNOSIS — Z87891 Personal history of nicotine dependence: Secondary | ICD-10-CM | POA: Diagnosis not present

## 2022-11-27 DIAGNOSIS — G4700 Insomnia, unspecified: Secondary | ICD-10-CM | POA: Diagnosis not present

## 2022-11-27 DIAGNOSIS — R519 Headache, unspecified: Secondary | ICD-10-CM | POA: Diagnosis not present

## 2022-11-27 DIAGNOSIS — F32A Depression, unspecified: Secondary | ICD-10-CM | POA: Diagnosis not present

## 2022-11-27 DIAGNOSIS — Z79899 Other long term (current) drug therapy: Secondary | ICD-10-CM | POA: Diagnosis not present

## 2022-11-27 DIAGNOSIS — G8929 Other chronic pain: Secondary | ICD-10-CM | POA: Diagnosis not present

## 2022-11-27 DIAGNOSIS — F419 Anxiety disorder, unspecified: Secondary | ICD-10-CM | POA: Diagnosis not present

## 2022-11-27 DIAGNOSIS — H9221 Otorrhagia, right ear: Secondary | ICD-10-CM | POA: Diagnosis not present

## 2022-11-27 DIAGNOSIS — Z888 Allergy status to other drugs, medicaments and biological substances status: Secondary | ICD-10-CM | POA: Diagnosis not present

## 2022-12-17 DIAGNOSIS — Z419 Encounter for procedure for purposes other than remedying health state, unspecified: Secondary | ICD-10-CM | POA: Diagnosis not present

## 2023-01-17 DIAGNOSIS — Z419 Encounter for procedure for purposes other than remedying health state, unspecified: Secondary | ICD-10-CM | POA: Diagnosis not present

## 2023-02-02 DIAGNOSIS — N390 Urinary tract infection, site not specified: Secondary | ICD-10-CM | POA: Diagnosis not present

## 2023-02-02 DIAGNOSIS — B3731 Acute candidiasis of vulva and vagina: Secondary | ICD-10-CM | POA: Diagnosis not present

## 2023-02-17 DIAGNOSIS — Z419 Encounter for procedure for purposes other than remedying health state, unspecified: Secondary | ICD-10-CM | POA: Diagnosis not present

## 2023-03-17 DIAGNOSIS — Z419 Encounter for procedure for purposes other than remedying health state, unspecified: Secondary | ICD-10-CM | POA: Diagnosis not present

## 2023-04-16 DIAGNOSIS — R519 Headache, unspecified: Secondary | ICD-10-CM | POA: Diagnosis not present

## 2023-04-16 DIAGNOSIS — Z8349 Family history of other endocrine, nutritional and metabolic diseases: Secondary | ICD-10-CM | POA: Diagnosis not present

## 2023-04-16 DIAGNOSIS — I10 Essential (primary) hypertension: Secondary | ICD-10-CM | POA: Diagnosis not present

## 2023-04-28 DIAGNOSIS — Z419 Encounter for procedure for purposes other than remedying health state, unspecified: Secondary | ICD-10-CM | POA: Diagnosis not present

## 2023-05-08 DIAGNOSIS — R519 Headache, unspecified: Secondary | ICD-10-CM | POA: Diagnosis not present

## 2023-05-28 DIAGNOSIS — Z419 Encounter for procedure for purposes other than remedying health state, unspecified: Secondary | ICD-10-CM | POA: Diagnosis not present

## 2023-06-19 ENCOUNTER — Ambulatory Visit

## 2023-06-20 ENCOUNTER — Ambulatory Visit: Admitting: Nurse Practitioner

## 2023-06-20 ENCOUNTER — Encounter: Payer: Self-pay | Admitting: Nurse Practitioner

## 2023-06-20 ENCOUNTER — Ambulatory Visit

## 2023-06-20 VITALS — BP 121/85 | HR 75 | Ht 62.0 in | Wt 185.0 lb

## 2023-06-20 DIAGNOSIS — Z30011 Encounter for initial prescription of contraceptive pills: Secondary | ICD-10-CM | POA: Diagnosis not present

## 2023-06-20 DIAGNOSIS — Z309 Encounter for contraceptive management, unspecified: Secondary | ICD-10-CM

## 2023-06-20 DIAGNOSIS — F3132 Bipolar disorder, current episode depressed, moderate: Secondary | ICD-10-CM | POA: Diagnosis not present

## 2023-06-20 MED ORDER — NORETHINDRONE 0.35 MG PO TABS: 1.0000 | ORAL_TABLET | Freq: Every day | ORAL | 13 refills | Status: AC

## 2023-06-20 NOTE — Progress Notes (Signed)
 Pt is here for annual visit today,Wet prep reviewed with patient. The patient was dispensed Micronor to be picked up at the pharmacy. I provided counseling today regarding the medication. We discussed the medication, the side effects and when to call clinic. Condoms declined. Patient given the opportunity to ask questions for any clarification. Austine Lefort, RN.

## 2023-06-28 DIAGNOSIS — Z419 Encounter for procedure for purposes other than remedying health state, unspecified: Secondary | ICD-10-CM | POA: Diagnosis not present

## 2023-07-06 DIAGNOSIS — F3132 Bipolar disorder, current episode depressed, moderate: Secondary | ICD-10-CM | POA: Diagnosis not present

## 2023-07-08 ENCOUNTER — Encounter: Payer: Self-pay | Admitting: Nurse Practitioner

## 2023-07-08 NOTE — Progress Notes (Signed)
 [employer] [facility] DEPARTMENT [facility] [address], [address] [place] [address] Main phone: [phone]  Family Planning Visit - Initial Visit  Subjective:  Tammy Owen is a 28 y.o.  [phone]   being seen today to discuss reproductive life planning.  The patient is currently using abstinence for pregnancy prevention. Patient does not want a pregnancy in the next year.   Patient reports they are looking for a method with the following characteristics:  Cycle control High efficacy at preventing pregnancy Minimal bleeding or improved bleeding profile Reduced androgen effects (i.e. decreased hair growth) Does not involve insertion  Method they can control starting and stopping  Patient has the following medical conditions: Patient Active Problem List   Diagnosis Date Noted   Miscarriage within last 12 months 05/24/2021    Chief Complaint  Patient presents with   Annual Exam    Pt is here for PE and [facility]    HPI Patient is a pleasant 28 y.o. female who presents to the office today to discuss contraception. She states she would like to try hormonal patches.   She denies tobacco use and cancer. 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 Patient reports no concerns today. Patient indicates no female partners in the last 2 and 12 months. She reports no sex in the past 18 months.  Patient indicates LMP began 1 day ago and she is still menstruating today.  Patient declines PE, PAP, or STI testing today.   STI screening Patient reports no partners in last year.  Does this patient desire STI screening?  No - personal choice. Patient has the right to decline.  Hepatitis C screening Has patient been screened once for HCV in the past?  No  No results found for: HCVAB  Does the patient meet criteria for HCV testing? No  (If yes-- Screen for HCV through [facility]) Criteria:  Since the last HCV result,  does the patient have any of the following? - Current drug use - Have a partner with drug use - Has been incarcerated  Hepatitis B screening Does the patient meet criteria for HBV testing? No Criteria:  -Household, sexual or needle sharing contact with HBV -History of drug use -HIV positive -Those with known Hep C  Cervical Cancer Screening  No Cervical Cancer Screening results to display.  Health Maintenance Due  Topic Date Due   HPV VACCINES (1 - 3-dose series) Never done   HIV Screening  Never done   Hepatitis C Screening  Never done   DTaP/Tdap/Td (1 - Tdap) Never done   Pneumococcal Vaccine 51-15 Years old (1 of 2 - PCV) Never done   Cervical Cancer Screening (Pap smear)  Never done   COVID-19 Vaccine (1 - 2024-25 season) Never done    The following portions of the patient's history were reviewed and updated as appropriate: allergies, current medications, past family history, past medical history, past social history, past surgical history and problem list. Problem list updated.  See flowsheet for further details and programmatic requirements Hyperlink available at the top of the signed note in blue.  Flow sheet content below:  Pregnancy Intention Screening Does the patient want to become pregnant in the next year?: No Does the patient's partner want to become pregnant in the next year?: No Would the patient like to discuss contraceptive options today?: Yes Contraception History Past methods of contraception used by patient:: Intrauterine device or system (IDU,IUS)  Adverse effects associated with Intrauterine device or system (IUD,IUS): none Sexual History What age did you start your period?: 13 How often do you have your period?: monthly Date of last sex?:  (18 months ago) Has the patient had unprotected sex within the last 5 days?: No Do you have sex with men, women, both men and women?: Men only In the past 2 months how many partners have you had sex with?: 0 In  the past 12 months, how many partners have you had sex with?: 0 Is it possible that any of your sex partners in the past 12 months had sex with someone else whild they were still in a sexual relationship with you?: No What ways do you have sex?: Vaginal Do you or your partner use condoms and/or dental dams every time you have vaginal, oral or anal sex?: Yes Do you douche?: No Date of last HIV test?: 11/06/19 Have you ever had an STD?: Yes Have any of your partners had an STD?: Yes Partner Previous STD?: Gonorrhea, Chlamydia Date?:  (2021) Have you or your partner ever shot up drugs?: No Have any of your partners used drugs in the past?: No Have you or your partners exchanged money or drugs for sex?: No  Objective:   Vitals:   06/20/23 1542  BP: 121/85  Pulse: 75  Weight: 185 lb (83.9 kg)  Height: 5' 2 (1.575 m)   Patient declined physical today. What is documented in PE portion of note is what could be assessed through conversation with patient only.    Physical Exam Nursing note reviewed.  Constitutional:      General: She is not in acute distress.    Appearance: Normal appearance.  HENT:     Mouth/Throat:     Lips: Pink. No lesions.   Eyes:     General: No scleral icterus.       Right eye: No discharge.        Left eye: No discharge.     Conjunctiva/sclera:     Right eye: Right conjunctiva is not injected. No exudate or hemorrhage.    Left eye: Left conjunctiva is not injected. No exudate or hemorrhage.  Pulmonary:     Effort: Pulmonary effort is normal.  Genitourinary:    Comments: Declined genital exam.   Skin:    Comments: Skin tone appropriate for ethnicity. Assessed exposed areas only.     Neurological:     Mental Status: She is alert and oriented to person, place, and time.   Psychiatric:        Attention and Perception: Attention and perception normal.        Mood and Affect: Mood and affect normal.        Speech: Speech normal.        Behavior:  Behavior normal. Behavior is cooperative.        Thought Content: Thought content normal.     Assessment and Plan:  Tammy Owen is a 28 y.o. female presenting to the [facility] Department for an initial annual wellness/contraceptive visit  Contraception counseling:  Reviewed options based on patient desire and reproductive life plan. Patient is interested in Oral Contraceptive. This was provided to the patient today.   Risks, benefits, and typical effectiveness rates were reviewed.  Questions were answered.  Written information was also given to the patient to review.    The patient will follow up in  1 years for surveillance.  The patient was told to call  with any further questions, or with any concerns about this method of contraception.  Emphasized use of condoms 100% of the time for STI prevention.  Emergency Contraception Precautions (ECP): Patient assessed for need of ECP. She is not a candidate based on no intercourse for 18 months.   1. Encounter for oral contraception initial prescription (Primary) Patient prescribed progestin-only pills due to personal history of stroke being a contraindication for estrogen containing methods.   - norethindrone  (MICRONOR ) 0.35 MG tablet; Take 1 tablet (0.35 mg total) by mouth daily.  Dispense: 28 tablet; Refill: 13  Return in about 1 year (around 06/19/2024) for annual well-woman exam.  No future appointments.  The level of service of this patient encounter was coded based on the total time (30 minutes) spent both face-to-face and non-face-to-face, including pre-visit, intra-visit, and post-visit activities.    Clarita LITTIE Narrow, NP

## 2023-07-09 DIAGNOSIS — F3132 Bipolar disorder, current episode depressed, moderate: Secondary | ICD-10-CM | POA: Diagnosis not present

## 2023-07-10 DIAGNOSIS — F3132 Bipolar disorder, current episode depressed, moderate: Secondary | ICD-10-CM | POA: Diagnosis not present

## 2023-07-12 ENCOUNTER — Encounter: Payer: Self-pay | Admitting: Emergency Medicine

## 2023-07-12 ENCOUNTER — Other Ambulatory Visit: Payer: Self-pay

## 2023-07-12 ENCOUNTER — Emergency Department
Admission: EM | Admit: 2023-07-12 | Discharge: 2023-07-12 | Disposition: A | Discharge status: Home / Self Care | Attending: Emergency Medicine | Admitting: Emergency Medicine

## 2023-07-12 DIAGNOSIS — I10 Essential (primary) hypertension: Secondary | ICD-10-CM | POA: Diagnosis not present

## 2023-07-12 DIAGNOSIS — R0689 Other abnormalities of breathing: Secondary | ICD-10-CM | POA: Diagnosis not present

## 2023-07-12 DIAGNOSIS — Z743 Need for continuous supervision: Secondary | ICD-10-CM | POA: Diagnosis not present

## 2023-07-12 DIAGNOSIS — T1490XA Injury, unspecified, initial encounter: Secondary | ICD-10-CM | POA: Diagnosis not present

## 2023-07-12 DIAGNOSIS — L309 Dermatitis, unspecified: Secondary | ICD-10-CM | POA: Diagnosis not present

## 2023-07-12 MED ORDER — HYDROXYZINE HCL 50 MG PO TABS
50.0000 mg | ORAL_TABLET | Freq: Once | ORAL | Status: AC
  Administered 2023-07-12: 50 mg via ORAL
  Filled 2023-07-12: qty 1

## 2023-07-12 MED ORDER — HYDROXYZINE HCL 25 MG PO TABS: 25.0000 mg | ORAL_TABLET | Freq: Three times a day (TID) | ORAL | 0 refills | Status: AC | PRN

## 2023-07-12 NOTE — Discharge Instructions (Signed)
 You may use your prescribed hydroxyzine as needed for itching.  You may use over-the-counter vitamin E oil as recommended by poison control on the areas of your skin that are itching.  As we discussed once you get home we would recommend that you shower once again and put on clean clothing.  Please follow-up with your doctor as needed.  Return to the emergency department for any visual changes any trouble breathing or any other symptom concerning to yourself.
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
## 2023-07-14 DIAGNOSIS — F3132 Bipolar disorder, current episode depressed, moderate: Secondary | ICD-10-CM | POA: Diagnosis not present

## 2023-07-20 DIAGNOSIS — F3132 Bipolar disorder, current episode depressed, moderate: Secondary | ICD-10-CM | POA: Diagnosis not present

## 2023-07-23 DIAGNOSIS — F3132 Bipolar disorder, current episode depressed, moderate: Secondary | ICD-10-CM | POA: Diagnosis not present

## 2023-07-25 DIAGNOSIS — F3132 Bipolar disorder, current episode depressed, moderate: Secondary | ICD-10-CM | POA: Diagnosis not present

## 2023-07-26 DIAGNOSIS — F3132 Bipolar disorder, current episode depressed, moderate: Secondary | ICD-10-CM | POA: Diagnosis not present

## 2023-07-28 DIAGNOSIS — Z419 Encounter for procedure for purposes other than remedying health state, unspecified: Secondary | ICD-10-CM | POA: Diagnosis not present

## 2023-07-28 DIAGNOSIS — F3132 Bipolar disorder, current episode depressed, moderate: Secondary | ICD-10-CM | POA: Diagnosis not present

## 2023-07-30 DIAGNOSIS — F3132 Bipolar disorder, current episode depressed, moderate: Secondary | ICD-10-CM | POA: Diagnosis not present

## 2023-08-01 DIAGNOSIS — F3132 Bipolar disorder, current episode depressed, moderate: Secondary | ICD-10-CM | POA: Diagnosis not present

## 2023-08-04 DIAGNOSIS — F3132 Bipolar disorder, current episode depressed, moderate: Secondary | ICD-10-CM | POA: Diagnosis not present

## 2023-08-06 DIAGNOSIS — F3132 Bipolar disorder, current episode depressed, moderate: Secondary | ICD-10-CM | POA: Diagnosis not present

## 2023-08-07 DIAGNOSIS — F3132 Bipolar disorder, current episode depressed, moderate: Secondary | ICD-10-CM | POA: Diagnosis not present

## 2023-08-12 DIAGNOSIS — F3132 Bipolar disorder, current episode depressed, moderate: Secondary | ICD-10-CM | POA: Diagnosis not present

## 2023-08-15 DIAGNOSIS — F3132 Bipolar disorder, current episode depressed, moderate: Secondary | ICD-10-CM | POA: Diagnosis not present

## 2023-08-18 DIAGNOSIS — F3132 Bipolar disorder, current episode depressed, moderate: Secondary | ICD-10-CM | POA: Diagnosis not present

## 2023-08-20 DIAGNOSIS — F3132 Bipolar disorder, current episode depressed, moderate: Secondary | ICD-10-CM | POA: Diagnosis not present

## 2023-08-21 DIAGNOSIS — F3132 Bipolar disorder, current episode depressed, moderate: Secondary | ICD-10-CM | POA: Diagnosis not present

## 2023-08-22 DIAGNOSIS — F3132 Bipolar disorder, current episode depressed, moderate: Secondary | ICD-10-CM | POA: Diagnosis not present

## 2023-08-26 DIAGNOSIS — F3132 Bipolar disorder, current episode depressed, moderate: Secondary | ICD-10-CM | POA: Diagnosis not present

## 2023-08-28 DIAGNOSIS — F3132 Bipolar disorder, current episode depressed, moderate: Secondary | ICD-10-CM | POA: Diagnosis not present

## 2023-08-28 DIAGNOSIS — Z419 Encounter for procedure for purposes other than remedying health state, unspecified: Secondary | ICD-10-CM | POA: Diagnosis not present

## 2023-08-29 DIAGNOSIS — F3132 Bipolar disorder, current episode depressed, moderate: Secondary | ICD-10-CM | POA: Diagnosis not present

## 2023-09-04 DIAGNOSIS — F3132 Bipolar disorder, current episode depressed, moderate: Secondary | ICD-10-CM | POA: Diagnosis not present

## 2023-09-07 DIAGNOSIS — F3132 Bipolar disorder, current episode depressed, moderate: Secondary | ICD-10-CM | POA: Diagnosis not present

## 2023-09-10 DIAGNOSIS — F3132 Bipolar disorder, current episode depressed, moderate: Secondary | ICD-10-CM | POA: Diagnosis not present

## 2023-09-13 DIAGNOSIS — F3132 Bipolar disorder, current episode depressed, moderate: Secondary | ICD-10-CM | POA: Diagnosis not present

## 2023-09-15 DIAGNOSIS — F3132 Bipolar disorder, current episode depressed, moderate: Secondary | ICD-10-CM | POA: Diagnosis not present

## 2023-09-18 DIAGNOSIS — F3132 Bipolar disorder, current episode depressed, moderate: Secondary | ICD-10-CM | POA: Diagnosis not present

## 2023-09-19 DIAGNOSIS — F3132 Bipolar disorder, current episode depressed, moderate: Secondary | ICD-10-CM | POA: Diagnosis not present

## 2023-09-22 DIAGNOSIS — F3132 Bipolar disorder, current episode depressed, moderate: Secondary | ICD-10-CM | POA: Diagnosis not present

## 2023-09-25 DIAGNOSIS — F3132 Bipolar disorder, current episode depressed, moderate: Secondary | ICD-10-CM | POA: Diagnosis not present

## 2023-09-27 DIAGNOSIS — F3132 Bipolar disorder, current episode depressed, moderate: Secondary | ICD-10-CM | POA: Diagnosis not present

## 2023-09-28 DIAGNOSIS — Z419 Encounter for procedure for purposes other than remedying health state, unspecified: Secondary | ICD-10-CM | POA: Diagnosis not present

## 2023-09-29 DIAGNOSIS — F3132 Bipolar disorder, current episode depressed, moderate: Secondary | ICD-10-CM | POA: Diagnosis not present

## 2023-10-01 DIAGNOSIS — F3132 Bipolar disorder, current episode depressed, moderate: Secondary | ICD-10-CM | POA: Diagnosis not present

## 2023-10-03 DIAGNOSIS — F3132 Bipolar disorder, current episode depressed, moderate: Secondary | ICD-10-CM | POA: Diagnosis not present

## 2023-10-07 DIAGNOSIS — F3132 Bipolar disorder, current episode depressed, moderate: Secondary | ICD-10-CM | POA: Diagnosis not present

## 2023-10-09 DIAGNOSIS — F3132 Bipolar disorder, current episode depressed, moderate: Secondary | ICD-10-CM | POA: Diagnosis not present

## 2023-10-12 ENCOUNTER — Ambulatory Visit
Admission: EM | Admit: 2023-10-12 | Discharge: 2023-10-12 | Disposition: A | Discharge status: Home / Self Care | Attending: Family Medicine | Admitting: Family Medicine

## 2023-10-12 DIAGNOSIS — Z202 Contact with and (suspected) exposure to infections with a predominantly sexual mode of transmission: Secondary | ICD-10-CM | POA: Diagnosis not present

## 2023-10-12 DIAGNOSIS — N341 Nonspecific urethritis: Secondary | ICD-10-CM | POA: Insufficient documentation

## 2023-10-12 MED ORDER — DOXYCYCLINE HYCLATE 100 MG PO CAPS: 100.0000 mg | ORAL_CAPSULE | Freq: Two times a day (BID) | ORAL | 0 refills | Status: AC

## 2023-10-12 NOTE — ED Triage Notes (Signed)
 Pt requesting STD testing-states she was notified +nongonococcal urethritis-pt denies sx-NAD-steady gait

## 2023-10-12 NOTE — Discharge Instructions (Signed)
 Avoid all forms of sexual intercourse (oral, vaginal, anal) for the next 7 days to avoid spreading/reinfecting or at least until we can see what kinds of infection results are positive.  Abstaining for 2 weeks would be better but at least 1 week is required.  We will let you know about your test results from the swab we did today and if you need any prescriptions for antibiotics or changes to your treatment from today.

## 2023-10-12 NOTE — ED Provider Notes (Signed)
 [facility]  Note:  This document was prepared using Conservation officer, historic buildings and may include unintentional dictation errors.  MRN: [id number] DOB: [birth year]  Subjective:   Tammy Owen is a 28 y.o. female presenting for STI exposure.  Her boyfriend was seen through the health department and was treated for nongonococcal urethritis.  Patient would like the same treatment.  Denies fever, n/v, abdominal pain, pelvic pain, rashes, dysuria, urinary frequency, hematuria, vaginal discharge.  She is not interested in HIV and syphilis testing but is agreeable to doing a vaginal cytology.  No concern for pregnancy.  No current facility-administered medications for this encounter.  Current Outpatient Medications:    hydrOXYzine  (ATARAX ) 25 MG tablet, Take 1 tablet (25 mg total) by mouth 3 (three) times daily as needed., Disp: 20 tablet, Rfl: 0   norethindrone  (MICRONOR ) 0.35 MG tablet, Take 1 tablet (0.35 mg total) by mouth daily., Disp: 28 tablet, Rfl: 13   Prenatal Vit-Fe Fumarate-FA (PNV PRENATAL PLUS MULTIVITAMIN) 27-1 MG TABS, Take by mouth., Disp: , Rfl:    Allergies  Allergen Reactions   Vicks Vaporub [Liniments]    Robitussin (Alcohol Free) [Guaifenesin] Nausea And Vomiting    Past Medical History:  Diagnosis Date   Bipolar 1 disorder (HCC)    Hypertension      Past Surgical History:  Procedure Laterality Date   NO PAST SURGERIES      No family history on file.  Social History   Tobacco Use   Smoking status: Every Day    Types: E-cigarettes   Smokeless tobacco: Never  Vaping Use   Vaping status: Every Day   Substances: Nicotine  Substance Use Topics   Alcohol use: Not Currently    Comment: socially   Drug use: Not Currently    ROS   Objective:   Vitals: BP (!) 122/91 (BP Location: Right Arm)   Pulse 72   Temp 98.6 F (37 C) (Oral)   Resp 16   LMP 10/08/2023   SpO2 97%   Physical Exam Constitutional:       General: She is not in acute distress.    Appearance: Normal appearance. She is well-developed. She is not ill-appearing, toxic-appearing or diaphoretic.  HENT:     Head: Normocephalic and atraumatic.     Nose: Nose normal.     Mouth/Throat:     Mouth: Mucous membranes are moist.  Eyes:     General: No scleral icterus.       Right eye: No discharge.        Left eye: No discharge.     Extraocular Movements: Extraocular movements intact.     Conjunctiva/sclera: Conjunctivae normal.  Cardiovascular:     Rate and Rhythm: Normal rate.  Pulmonary:     Effort: Pulmonary effort is normal.  Abdominal:     General: Bowel sounds are normal. There is no distension.     Palpations: Abdomen is soft. There is no mass.     Tenderness: There is no abdominal tenderness. There is no right CVA tenderness, left CVA tenderness, guarding or rebound.  Skin:    General: Skin is warm and dry.  Neurological:     General: No focal deficit present.     Mental Status: She is alert and oriented to person, place, and time.  Psychiatric:        Mood and Affect: Mood normal.        Behavior: Behavior normal.  Thought Content: Thought content normal.        Judgment: Judgment normal.     Assessment and Plan :   PDMP not reviewed this encounter.  1. Non-gonococcal urethritis (NGU)   2. Exposure to STD    Will treat empirically with doxycycline .  Labs pending.  Recommended abstaining until completion of treatment for both her and her partner.  Counseled patient on potential for adverse effects with medications prescribed/recommended today, ER and return-to-clinic precautions discussed, patient verbalized understanding.    Christopher Savannah, [place] 10/12/23 1151

## 2023-10-13 DIAGNOSIS — F3132 Bipolar disorder, current episode depressed, moderate: Secondary | ICD-10-CM | POA: Diagnosis not present

## 2023-10-14 DIAGNOSIS — F3132 Bipolar disorder, current episode depressed, moderate: Secondary | ICD-10-CM | POA: Diagnosis not present

## 2023-10-15 LAB — CERVICOVAGINAL ANCILLARY ONLY
Chlamydia: NEGATIVE
Comment: NEGATIVE
Comment: NEGATIVE
Comment: NORMAL
Neisseria Gonorrhea: NEGATIVE
Trichomonas: NEGATIVE

## 2023-10-16 LAB — MISC LABCORP TEST (SEND OUT): Labcorp test code: 180076

## 2023-10-17 DIAGNOSIS — F3132 Bipolar disorder, current episode depressed, moderate: Secondary | ICD-10-CM | POA: Diagnosis not present

## 2023-10-20 DIAGNOSIS — F3132 Bipolar disorder, current episode depressed, moderate: Secondary | ICD-10-CM | POA: Diagnosis not present

## 2023-10-21 DIAGNOSIS — F3132 Bipolar disorder, current episode depressed, moderate: Secondary | ICD-10-CM | POA: Diagnosis not present

## 2023-11-28 DIAGNOSIS — Z419 Encounter for procedure for purposes other than remedying health state, unspecified: Secondary | ICD-10-CM | POA: Diagnosis not present

## 2023-12-28 DIAGNOSIS — Z419 Encounter for procedure for purposes other than remedying health state, unspecified: Secondary | ICD-10-CM | POA: Diagnosis not present

## 2024-06-19 ENCOUNTER — Ambulatory Visit: Admitting: Nurse Practitioner
